# Patient Record
Sex: Female | Born: 1950
Health system: Southern US, Community
[De-identification: ages and names within clinical notes are randomized; demographics above are authoritative.]

## PROBLEM LIST (undated history)

## (undated) ENCOUNTER — Ambulatory Visit (HOSPITAL_COMMUNITY): Discharge status: Absent without leave | Payer: Medicare HMO

## (undated) DIAGNOSIS — R112 Nausea with vomiting, unspecified: Secondary | ICD-10-CM

## (undated) DIAGNOSIS — E039 Hypothyroidism, unspecified: Secondary | ICD-10-CM

## (undated) DIAGNOSIS — G5710 Meralgia paresthetica, unspecified lower limb: Secondary | ICD-10-CM

## (undated) DIAGNOSIS — R519 Headache, unspecified: Secondary | ICD-10-CM

## (undated) DIAGNOSIS — G4700 Insomnia, unspecified: Secondary | ICD-10-CM

## (undated) DIAGNOSIS — I10 Essential (primary) hypertension: Secondary | ICD-10-CM

## (undated) DIAGNOSIS — K219 Gastro-esophageal reflux disease without esophagitis: Secondary | ICD-10-CM

## (undated) DIAGNOSIS — N189 Chronic kidney disease, unspecified: Secondary | ICD-10-CM

## (undated) DIAGNOSIS — G5600 Carpal tunnel syndrome, unspecified upper limb: Secondary | ICD-10-CM

## (undated) DIAGNOSIS — Z9889 Other specified postprocedural states: Secondary | ICD-10-CM

## (undated) DIAGNOSIS — R51 Headache: Secondary | ICD-10-CM

## (undated) DIAGNOSIS — C73 Malignant neoplasm of thyroid gland: Secondary | ICD-10-CM

## (undated) DIAGNOSIS — E785 Hyperlipidemia, unspecified: Secondary | ICD-10-CM

## (undated) DIAGNOSIS — M1990 Unspecified osteoarthritis, unspecified site: Secondary | ICD-10-CM

## (undated) HISTORY — DX: Malignant neoplasm of thyroid gland: C73

## (undated) HISTORY — PX: THYROIDECTOMY: SHX17

## (undated) HISTORY — DX: Hyperlipidemia, unspecified: E78.5

## (undated) HISTORY — DX: Chronic kidney disease, unspecified: N18.9

## (undated) HISTORY — DX: Insomnia, unspecified: G47.00

## (undated) HISTORY — PX: KNEE SURGERY: SHX244

## (undated) HISTORY — PX: COLONOSCOPY: SHX174

## (undated) HISTORY — DX: Meralgia paresthetica, unspecified lower limb: G57.10

## (undated) HISTORY — DX: Carpal tunnel syndrome, unspecified upper limb: G56.00

---

## 1998-01-12 ENCOUNTER — Ambulatory Visit (HOSPITAL_COMMUNITY): Admission: RE | Admit: 1998-01-12 | Discharge: 1998-01-12 | Payer: Self-pay | Admitting: Internal Medicine

## 1998-01-12 ENCOUNTER — Encounter: Payer: Self-pay | Admitting: Internal Medicine

## 1998-02-04 ENCOUNTER — Ambulatory Visit (HOSPITAL_COMMUNITY): Admission: RE | Admit: 1998-02-04 | Discharge: 1998-02-04 | Payer: Self-pay | Admitting: Gastroenterology

## 1998-02-22 ENCOUNTER — Other Ambulatory Visit: Admission: RE | Admit: 1998-02-22 | Discharge: 1998-02-22 | Payer: Self-pay | Admitting: Obstetrics and Gynecology

## 1998-06-14 ENCOUNTER — Ambulatory Visit (HOSPITAL_COMMUNITY): Admission: RE | Admit: 1998-06-14 | Discharge: 1998-06-14 | Payer: Self-pay | Admitting: Obstetrics and Gynecology

## 1999-01-21 ENCOUNTER — Encounter: Payer: Self-pay | Admitting: Internal Medicine

## 1999-01-21 ENCOUNTER — Ambulatory Visit (HOSPITAL_COMMUNITY): Admission: RE | Admit: 1999-01-21 | Discharge: 1999-01-21 | Payer: Self-pay | Admitting: Internal Medicine

## 2000-08-10 ENCOUNTER — Encounter: Admission: RE | Admit: 2000-08-10 | Discharge: 2000-08-10 | Payer: Self-pay | Admitting: Orthopedic Surgery

## 2000-08-10 ENCOUNTER — Encounter: Payer: Self-pay | Admitting: Orthopedic Surgery

## 2001-05-21 ENCOUNTER — Other Ambulatory Visit: Admission: RE | Admit: 2001-05-21 | Discharge: 2001-05-21 | Payer: Self-pay | Admitting: Obstetrics and Gynecology

## 2001-05-30 ENCOUNTER — Ambulatory Visit (HOSPITAL_COMMUNITY): Admission: RE | Admit: 2001-05-30 | Discharge: 2001-05-30 | Payer: Self-pay | Admitting: Obstetrics and Gynecology

## 2001-05-30 ENCOUNTER — Encounter: Payer: Self-pay | Admitting: Obstetrics and Gynecology

## 2002-06-05 ENCOUNTER — Encounter: Payer: Self-pay | Admitting: Internal Medicine

## 2002-06-05 ENCOUNTER — Encounter: Admission: RE | Admit: 2002-06-05 | Discharge: 2002-06-05 | Payer: Self-pay | Admitting: Internal Medicine

## 2003-06-09 ENCOUNTER — Encounter: Admission: RE | Admit: 2003-06-09 | Discharge: 2003-06-09 | Payer: Self-pay | Admitting: Internal Medicine

## 2003-08-05 ENCOUNTER — Other Ambulatory Visit: Admission: RE | Admit: 2003-08-05 | Discharge: 2003-08-05 | Payer: Self-pay | Admitting: Internal Medicine

## 2003-08-11 ENCOUNTER — Encounter: Admission: RE | Admit: 2003-08-11 | Discharge: 2003-08-11 | Payer: Self-pay | Admitting: Internal Medicine

## 2003-08-11 ENCOUNTER — Encounter (HOSPITAL_COMMUNITY): Admission: RE | Admit: 2003-08-11 | Discharge: 2003-11-09 | Payer: Self-pay | Admitting: Internal Medicine

## 2003-08-21 ENCOUNTER — Encounter (INDEPENDENT_AMBULATORY_CARE_PROVIDER_SITE_OTHER): Payer: Self-pay | Admitting: Specialist

## 2004-07-11 ENCOUNTER — Encounter: Admission: RE | Admit: 2004-07-11 | Discharge: 2004-07-11 | Payer: Self-pay | Admitting: Internal Medicine

## 2004-11-11 ENCOUNTER — Other Ambulatory Visit: Admission: RE | Admit: 2004-11-11 | Discharge: 2004-11-11 | Payer: Self-pay | Admitting: Internal Medicine

## 2005-07-14 ENCOUNTER — Encounter: Admission: RE | Admit: 2005-07-14 | Discharge: 2005-07-14 | Payer: Self-pay | Admitting: Internal Medicine

## 2005-07-31 ENCOUNTER — Encounter: Admission: RE | Admit: 2005-07-31 | Discharge: 2005-07-31 | Payer: Self-pay | Admitting: Internal Medicine

## 2005-08-14 ENCOUNTER — Other Ambulatory Visit: Admission: RE | Admit: 2005-08-14 | Discharge: 2005-08-14 | Payer: Self-pay | Admitting: Obstetrics and Gynecology

## 2006-07-27 ENCOUNTER — Encounter: Admission: RE | Admit: 2006-07-27 | Discharge: 2006-07-27 | Payer: Self-pay | Admitting: Internal Medicine

## 2006-11-20 ENCOUNTER — Other Ambulatory Visit: Admission: RE | Admit: 2006-11-20 | Discharge: 2006-11-20 | Payer: Self-pay | Admitting: Internal Medicine

## 2007-08-02 ENCOUNTER — Encounter: Admission: RE | Admit: 2007-08-02 | Discharge: 2007-08-02 | Payer: Self-pay | Admitting: Internal Medicine

## 2007-12-13 ENCOUNTER — Other Ambulatory Visit: Admission: RE | Admit: 2007-12-13 | Discharge: 2007-12-13 | Payer: Self-pay | Admitting: Internal Medicine

## 2007-12-18 ENCOUNTER — Ambulatory Visit (HOSPITAL_COMMUNITY): Admission: RE | Admit: 2007-12-18 | Discharge: 2007-12-18 | Payer: Self-pay | Admitting: Internal Medicine

## 2008-01-08 ENCOUNTER — Encounter (INDEPENDENT_AMBULATORY_CARE_PROVIDER_SITE_OTHER): Payer: Self-pay | Admitting: Interventional Radiology

## 2008-01-08 ENCOUNTER — Ambulatory Visit (HOSPITAL_COMMUNITY): Admission: RE | Admit: 2008-01-08 | Discharge: 2008-01-08 | Payer: Self-pay | Admitting: Internal Medicine

## 2008-03-30 ENCOUNTER — Encounter (INDEPENDENT_AMBULATORY_CARE_PROVIDER_SITE_OTHER): Payer: Self-pay | Admitting: Surgery

## 2008-03-30 ENCOUNTER — Ambulatory Visit (HOSPITAL_COMMUNITY): Admission: RE | Admit: 2008-03-30 | Discharge: 2008-04-01 | Payer: Self-pay | Admitting: Surgery

## 2008-04-23 ENCOUNTER — Other Ambulatory Visit: Admission: RE | Admit: 2008-04-23 | Discharge: 2008-04-23 | Payer: Self-pay | Admitting: Internal Medicine

## 2008-08-03 ENCOUNTER — Encounter: Admission: RE | Admit: 2008-08-03 | Discharge: 2008-08-03 | Payer: Self-pay | Admitting: Internal Medicine

## 2009-03-29 ENCOUNTER — Encounter: Admission: RE | Admit: 2009-03-29 | Discharge: 2009-03-29 | Payer: Self-pay | Admitting: Gastroenterology

## 2009-06-24 ENCOUNTER — Other Ambulatory Visit: Admission: RE | Admit: 2009-06-24 | Discharge: 2009-06-24 | Payer: Self-pay | Admitting: Obstetrics and Gynecology

## 2009-08-06 ENCOUNTER — Encounter: Admission: RE | Admit: 2009-08-06 | Discharge: 2009-08-06 | Payer: Self-pay | Admitting: Internal Medicine

## 2010-01-30 ENCOUNTER — Encounter: Payer: Self-pay | Admitting: Internal Medicine

## 2010-04-12 ENCOUNTER — Other Ambulatory Visit: Payer: Self-pay | Admitting: Internal Medicine

## 2010-04-12 DIAGNOSIS — R12 Heartburn: Secondary | ICD-10-CM

## 2010-04-14 ENCOUNTER — Other Ambulatory Visit: Payer: Self-pay

## 2010-04-15 ENCOUNTER — Ambulatory Visit
Admission: RE | Admit: 2010-04-15 | Discharge: 2010-04-15 | Disposition: A | Discharge status: Home / Self Care | Payer: BC Managed Care – PPO | Source: Ambulatory Visit | Attending: Internal Medicine | Admitting: Internal Medicine

## 2010-04-15 DIAGNOSIS — R12 Heartburn: Secondary | ICD-10-CM

## 2010-04-21 LAB — DIFFERENTIAL
Basophils Absolute: 0 10*3/uL (ref 0.0–0.1)
Basophils Relative: 1 % (ref 0–1)
Eosinophils Absolute: 0 10*3/uL (ref 0.0–0.7)
Eosinophils Relative: 1 % (ref 0–5)
Lymphocytes Relative: 35 % (ref 12–46)
Lymphs Abs: 1.5 10*3/uL (ref 0.7–4.0)
Monocytes Absolute: 0.4 10*3/uL (ref 0.1–1.0)
Monocytes Relative: 9 % (ref 3–12)
Neutro Abs: 2.4 10*3/uL (ref 1.7–7.7)
Neutrophils Relative %: 55 % (ref 43–77)

## 2010-04-21 LAB — CBC
HCT: 39.4 % (ref 36.0–46.0)
Hemoglobin: 13.4 g/dL (ref 12.0–15.0)
MCHC: 33.9 g/dL (ref 30.0–36.0)
MCV: 89.5 fL (ref 78.0–100.0)
Platelets: 273 10*3/uL (ref 150–400)
RBC: 4.4 MIL/uL (ref 3.87–5.11)
RDW: 13.2 % (ref 11.5–15.5)
WBC: 4.4 10*3/uL (ref 4.0–10.5)

## 2010-04-21 LAB — CALCIUM
Calcium: 7.7 mg/dL — ABNORMAL LOW (ref 8.4–10.5)
Calcium: 7.7 mg/dL — ABNORMAL LOW (ref 8.4–10.5)
Calcium: 8.4 mg/dL (ref 8.4–10.5)

## 2010-04-21 LAB — URINALYSIS, ROUTINE W REFLEX MICROSCOPIC
Glucose, UA: NEGATIVE mg/dL
Hgb urine dipstick: NEGATIVE
Ketones, ur: NEGATIVE mg/dL
Nitrite: NEGATIVE
Protein, ur: NEGATIVE mg/dL
Specific Gravity, Urine: 1.03 (ref 1.005–1.030)
Urobilinogen, UA: 1 mg/dL (ref 0.0–1.0)
pH: 6 (ref 5.0–8.0)

## 2010-04-21 LAB — PROTIME-INR
INR: 0.9 (ref 0.00–1.49)
Prothrombin Time: 12.4 seconds (ref 11.6–15.2)

## 2010-04-21 LAB — BASIC METABOLIC PANEL
BUN: 10 mg/dL (ref 6–23)
CO2: 31 mEq/L (ref 19–32)
Calcium: 8.9 mg/dL (ref 8.4–10.5)
Chloride: 105 mEq/L (ref 96–112)
Creatinine, Ser: 0.76 mg/dL (ref 0.4–1.2)
GFR calc Af Amer: >60 mL/min (ref >60)
GFR calc non Af Amer: >60 mL/min (ref >60)
Glucose, Bld: 107 mg/dL — ABNORMAL HIGH (ref 70–99)
Potassium: 3.8 mEq/L (ref 3.5–5.1)
Sodium: 141 mEq/L (ref 135–145)

## 2010-05-24 NOTE — Op Note (Signed)
[name], [name]                ACCOUNT NO.:  [id number]   MEDICAL RECORD NO.:  [id number]          PATIENT TYPE:  OIB   LOCATION:  0098                         FACILITY:  [facility]   PHYSICIAN:  Velora Heckler, MD      DATE OF BIRTH:  08-21-1950   DATE OF PROCEDURE:  03/30/2008  DATE OF DISCHARGE:                               OPERATIVE REPORT   PREOPERATIVE DIAGNOSIS:  Thyroid goiter with compressive symptoms.   POSTOPERATIVE DIAGNOSIS:  Thyroid goiter with compressive symptoms.   PROCEDURE:  Total thyroidectomy.   SURGEON:  Velora Heckler, M.D., FACS   ASSISTANT:  Ovidio Kin, M.D., FACS   ANESTHESIA:  General per Dr. Sherrian Divers.   ESTIMATED BLOOD LOSS:  Minimal.   PREPARATION:  Betadine.   COMPLICATIONS:  None.   INDICATIONS:  The patient is a 60 year old black female from [place],  [place].  She has had a known thyroid goiter for approximately 25  years.  It has gradually increased in size.  There has been significant  increase in the thyroid nodules on ultrasound studies from 2005-2009.  The patient has developed mild compressive symptoms including occasional  hoarseness, dysphagia, globus sensation, and on occasion choking  episodes.  Fine-needle aspiration biopsies have had benign  cytopathology.  The patient has seen Dr. Talmage Coin in consultation.  He has now referred the patient to our practice for consideration for  total thyroidectomy.   BODY OF REPORT:  The procedure is done in OR #6 at the [facility].  The patient is brought to the operating room,  placed in a supine position on the operating room table.  Following  administration of general anesthesia, the patient is positioned and then  prepped and draped in the usual strict aseptic fashion.  After  ascertaining that an adequate level of anesthesia had been achieved, a  Kocher incision is made with a #15 blade.  Dissection is carried through  subcutaneous tissues and  platysma.  Hemostasis is obtained with the  electrocautery.  Subplatysmal flaps are then elevated from the thyroid  notch to the sternal notch.  A Mahorner self-retaining retractor is  placed for exposure.  Strap muscles are incised in the midline.  Dissection is begun on the left side of the neck.  Strap muscles are  reflected laterally and the left lobe is exposed.  Venous tributaries  are divided between medium Ligaclips with the Harmonic scalpel.  Superior pole was dissected out.  Superior pole vessels are divided  between medium Ligaclips with the Harmonic scalpel.  Care was taken to  preserve the superior parathyroid gland.  Inferiorly, there was a  multinodular goiter that extends beneath the left clavicle.  This was  gently mobilized with blunt dissection.  Venous tributaries are divided  between medium Ligaclips with the Harmonic scalpel.  The inferior vein  on the left is ligated in continuity with 2-0 silk ties and divided with  the Harmonic scalpel.  The gland is then mobilized anteriorly.  Branches  of the inferior thyroid artery are divided between small Ligaclips with  the Harmonic scalpel.  The recurrent nerve was identified and preserved.  Ligament of Allyson Sabal is transected with the electrocautery and the gland is  mobilized up and onto the anterior trachea.  There was a moderate-sized  colloid nodule inferiorly which was ruptured and evacuated.  Isthmus is  mobilized across the midline with the electrocautery.  There is a small  pyramidal lobe which was excised with the electrocautery with the  isthmus.  A dry pack is then placed in the left neck.   Next, the right side is addressed.  Strap muscles are reflected  laterally.  The right lobe is exposed.  Middle thyroid vein is divided  between Ligaclips with the Harmonic scalpel.  Superior pole is dissected  out.  Superior pole vessels are divided between medium Ligaclips with  the Harmonic scalpel.  Inferiorly, venous  tributaries are divided  between small and medium Ligaclips with the Harmonic scalpel.  The gland  is rolled anteriorly.  Recurrent nerve is identified and preserved.  Branches of the inferior thyroid artery are divided between small  Ligaclips.  Ligament of Allyson Sabal is transected with electrocautery and the  gland is excised off the anterior trachea using the electrocautery for  hemostasis.  The specimen is submitted to pathology.  A suture is used  to mark the left superior pole.  Neck is then copiously irrigated with  warm saline.  This was evacuated.  Good hemostasis is noted.  Surgicel  was placed bilaterally in the operative field.  Strap muscles were  reapproximated in the midline with interrupted 3-0 Vicryl sutures.  Platysma is closed with interrupted 3-0 Vicryl sutures.  Skin is closed  with a running 4-0 Monocryl subcuticular suture.  Wound is washed and  dried and Benzoin and Steri-Strips are applied.  Sterile dressings are  applied.  The patient is awakened from anesthesia and brought to the  recovery room in stable condition.  The patient tolerated the procedure  well.      Velora Heckler, MD  Electronically Signed     TMG/MEDQ  D:  03/30/2008  T:  03/30/2008  Job:  [id number]   cc:   Candyce Churn, M.D.  Fax: [phone]   Talmage Coin, M.D.

## 2010-06-28 ENCOUNTER — Other Ambulatory Visit (HOSPITAL_COMMUNITY)
Admission: RE | Admit: 2010-06-28 | Discharge: 2010-06-28 | Disposition: A | Discharge status: Home / Self Care | Payer: BC Managed Care – PPO | Source: Ambulatory Visit | Attending: Obstetrics and Gynecology | Admitting: Obstetrics and Gynecology

## 2010-06-28 ENCOUNTER — Other Ambulatory Visit: Payer: Self-pay | Admitting: Obstetrics and Gynecology

## 2010-06-28 DIAGNOSIS — Z01419 Encounter for gynecological examination (general) (routine) without abnormal findings: Secondary | ICD-10-CM | POA: Insufficient documentation

## 2010-07-14 ENCOUNTER — Other Ambulatory Visit: Payer: Self-pay | Admitting: Internal Medicine

## 2010-07-14 DIAGNOSIS — Z1231 Encounter for screening mammogram for malignant neoplasm of breast: Secondary | ICD-10-CM

## 2010-08-08 ENCOUNTER — Ambulatory Visit
Admission: RE | Admit: 2010-08-08 | Discharge: 2010-08-08 | Disposition: A | Discharge status: Home / Self Care | Payer: BC Managed Care – PPO | Source: Ambulatory Visit | Attending: Internal Medicine | Admitting: Internal Medicine

## 2010-08-08 DIAGNOSIS — Z1231 Encounter for screening mammogram for malignant neoplasm of breast: Secondary | ICD-10-CM

## 2011-06-27 ENCOUNTER — Other Ambulatory Visit: Payer: Self-pay | Admitting: Internal Medicine

## 2011-06-27 DIAGNOSIS — Z1231 Encounter for screening mammogram for malignant neoplasm of breast: Secondary | ICD-10-CM

## 2011-08-08 ENCOUNTER — Ambulatory Visit: Payer: BC Managed Care – PPO

## 2011-08-22 ENCOUNTER — Ambulatory Visit
Admission: RE | Admit: 2011-08-22 | Discharge: 2011-08-22 | Disposition: A | Discharge status: Home / Self Care | Payer: BC Managed Care – PPO | Source: Ambulatory Visit | Attending: Internal Medicine | Admitting: Internal Medicine

## 2011-08-22 DIAGNOSIS — Z1231 Encounter for screening mammogram for malignant neoplasm of breast: Secondary | ICD-10-CM

## 2011-09-14 ENCOUNTER — Other Ambulatory Visit (HOSPITAL_COMMUNITY)
Admission: RE | Admit: 2011-09-14 | Discharge: 2011-09-14 | Disposition: A | Discharge status: Home / Self Care | Payer: BC Managed Care – PPO | Source: Ambulatory Visit | Attending: Obstetrics and Gynecology | Admitting: Obstetrics and Gynecology

## 2011-09-14 ENCOUNTER — Other Ambulatory Visit: Payer: Self-pay | Admitting: Nurse Practitioner

## 2011-09-14 DIAGNOSIS — Z01419 Encounter for gynecological examination (general) (routine) without abnormal findings: Secondary | ICD-10-CM | POA: Insufficient documentation

## 2011-09-14 DIAGNOSIS — Z1151 Encounter for screening for human papillomavirus (HPV): Secondary | ICD-10-CM | POA: Insufficient documentation

## 2012-05-01 ENCOUNTER — Other Ambulatory Visit: Payer: Self-pay | Admitting: Internal Medicine

## 2012-07-22 ENCOUNTER — Other Ambulatory Visit: Payer: Self-pay

## 2012-07-22 DIAGNOSIS — Z1231 Encounter for screening mammogram for malignant neoplasm of breast: Secondary | ICD-10-CM

## 2012-08-22 ENCOUNTER — Ambulatory Visit
Admission: RE | Admit: 2012-08-22 | Discharge: 2012-08-22 | Disposition: A | Discharge status: Home / Self Care | Payer: BC Managed Care – PPO | Source: Ambulatory Visit

## 2012-08-22 DIAGNOSIS — Z1231 Encounter for screening mammogram for malignant neoplasm of breast: Secondary | ICD-10-CM

## 2013-07-21 ENCOUNTER — Other Ambulatory Visit: Payer: Self-pay

## 2013-07-21 DIAGNOSIS — Z1231 Encounter for screening mammogram for malignant neoplasm of breast: Secondary | ICD-10-CM

## 2013-08-25 ENCOUNTER — Encounter (INDEPENDENT_AMBULATORY_CARE_PROVIDER_SITE_OTHER): Payer: Self-pay

## 2013-08-25 ENCOUNTER — Ambulatory Visit
Admission: RE | Admit: 2013-08-25 | Discharge: 2013-08-25 | Disposition: A | Discharge status: Home / Self Care | Payer: 59 | Source: Ambulatory Visit

## 2013-08-25 DIAGNOSIS — Z1231 Encounter for screening mammogram for malignant neoplasm of breast: Secondary | ICD-10-CM

## 2014-01-12 DIAGNOSIS — C73 Malignant neoplasm of thyroid gland: Secondary | ICD-10-CM | POA: Diagnosis not present

## 2014-01-12 DIAGNOSIS — E039 Hypothyroidism, unspecified: Secondary | ICD-10-CM | POA: Diagnosis not present

## 2014-05-18 DIAGNOSIS — E785 Hyperlipidemia, unspecified: Secondary | ICD-10-CM | POA: Diagnosis not present

## 2014-05-18 DIAGNOSIS — E559 Vitamin D deficiency, unspecified: Secondary | ICD-10-CM | POA: Diagnosis not present

## 2014-05-18 DIAGNOSIS — C73 Malignant neoplasm of thyroid gland: Secondary | ICD-10-CM | POA: Diagnosis not present

## 2014-05-18 DIAGNOSIS — Z0001 Encounter for general adult medical examination with abnormal findings: Secondary | ICD-10-CM | POA: Diagnosis not present

## 2014-05-18 DIAGNOSIS — Z1389 Encounter for screening for other disorder: Secondary | ICD-10-CM | POA: Diagnosis not present

## 2014-05-18 DIAGNOSIS — E890 Postprocedural hypothyroidism: Secondary | ICD-10-CM | POA: Diagnosis not present

## 2014-05-18 DIAGNOSIS — Z79899 Other long term (current) drug therapy: Secondary | ICD-10-CM | POA: Diagnosis not present

## 2014-05-18 DIAGNOSIS — I10 Essential (primary) hypertension: Secondary | ICD-10-CM | POA: Diagnosis not present

## 2014-06-12 DIAGNOSIS — T7840XA Allergy, unspecified, initial encounter: Secondary | ICD-10-CM | POA: Diagnosis not present

## 2014-06-29 ENCOUNTER — Other Ambulatory Visit: Payer: Self-pay

## 2014-06-29 DIAGNOSIS — Z1231 Encounter for screening mammogram for malignant neoplasm of breast: Secondary | ICD-10-CM

## 2014-08-31 ENCOUNTER — Ambulatory Visit
Admission: RE | Admit: 2014-08-31 | Discharge: 2014-08-31 | Disposition: A | Discharge status: Home / Self Care | Payer: Commercial Managed Care - HMO | Source: Ambulatory Visit

## 2014-08-31 DIAGNOSIS — Z1231 Encounter for screening mammogram for malignant neoplasm of breast: Secondary | ICD-10-CM | POA: Diagnosis not present

## 2014-10-07 DIAGNOSIS — Z23 Encounter for immunization: Secondary | ICD-10-CM | POA: Diagnosis not present

## 2014-10-15 ENCOUNTER — Other Ambulatory Visit: Payer: Self-pay | Admitting: Nurse Practitioner

## 2014-10-15 ENCOUNTER — Other Ambulatory Visit (HOSPITAL_COMMUNITY)
Admission: RE | Admit: 2014-10-15 | Discharge: 2014-10-15 | Disposition: A | Discharge status: Home / Self Care | Payer: Commercial Managed Care - HMO | Source: Ambulatory Visit | Attending: Nurse Practitioner | Admitting: Nurse Practitioner

## 2014-10-15 DIAGNOSIS — Z1151 Encounter for screening for human papillomavirus (HPV): Secondary | ICD-10-CM | POA: Insufficient documentation

## 2014-10-15 DIAGNOSIS — Z01419 Encounter for gynecological examination (general) (routine) without abnormal findings: Secondary | ICD-10-CM | POA: Insufficient documentation

## 2014-10-15 DIAGNOSIS — R6882 Decreased libido: Secondary | ICD-10-CM | POA: Diagnosis not present

## 2014-10-16 LAB — CYTOLOGY - PAP

## 2014-10-27 DIAGNOSIS — N949 Unspecified condition associated with female genital organs and menstrual cycle: Secondary | ICD-10-CM | POA: Diagnosis not present

## 2014-12-21 DIAGNOSIS — H5210 Myopia, unspecified eye: Secondary | ICD-10-CM | POA: Diagnosis not present

## 2014-12-21 DIAGNOSIS — H5203 Hypermetropia, bilateral: Secondary | ICD-10-CM | POA: Diagnosis not present

## 2015-01-12 DIAGNOSIS — R7989 Other specified abnormal findings of blood chemistry: Secondary | ICD-10-CM | POA: Diagnosis not present

## 2015-01-12 DIAGNOSIS — R938 Abnormal findings on diagnostic imaging of other specified body structures: Secondary | ICD-10-CM | POA: Diagnosis not present

## 2015-01-12 DIAGNOSIS — C73 Malignant neoplasm of thyroid gland: Secondary | ICD-10-CM | POA: Diagnosis not present

## 2015-01-12 DIAGNOSIS — E039 Hypothyroidism, unspecified: Secondary | ICD-10-CM | POA: Diagnosis not present

## 2015-01-14 DIAGNOSIS — B351 Tinea unguium: Secondary | ICD-10-CM | POA: Diagnosis not present

## 2015-01-14 DIAGNOSIS — Z85850 Personal history of malignant neoplasm of thyroid: Secondary | ICD-10-CM | POA: Diagnosis not present

## 2015-01-14 DIAGNOSIS — L602 Onychogryphosis: Secondary | ICD-10-CM | POA: Diagnosis not present

## 2015-01-14 DIAGNOSIS — E039 Hypothyroidism, unspecified: Secondary | ICD-10-CM | POA: Diagnosis not present

## 2015-01-20 ENCOUNTER — Ambulatory Visit: Payer: Commercial Managed Care - HMO | Admitting: Podiatry

## 2015-01-25 ENCOUNTER — Ambulatory Visit: Payer: Commercial Managed Care - HMO | Admitting: Podiatry

## 2015-02-03 DIAGNOSIS — L602 Onychogryphosis: Secondary | ICD-10-CM | POA: Diagnosis not present

## 2015-02-03 DIAGNOSIS — B353 Tinea pedis: Secondary | ICD-10-CM | POA: Diagnosis not present

## 2015-02-04 ENCOUNTER — Encounter: Payer: Self-pay | Admitting: Podiatry

## 2015-02-04 ENCOUNTER — Ambulatory Visit (INDEPENDENT_AMBULATORY_CARE_PROVIDER_SITE_OTHER): Payer: Commercial Managed Care - HMO | Admitting: Podiatry

## 2015-02-04 VITALS — BP 140/79 | HR 89 | Resp 16

## 2015-02-04 DIAGNOSIS — B351 Tinea unguium: Secondary | ICD-10-CM

## 2015-02-04 NOTE — Progress Notes (Signed)
   Subjective:    Patient ID: [name], female    DOB: 25-Dec-1950, 65 y.o.   MRN: [id number]  HPI Patient presents with a bilateral nail problem; great toes; nail discoloration & thickened nails; x2 months.   Review of Systems  All other systems reviewed and are negative.      Objective:   Physical Exam        Assessment & Plan:

## 2015-02-04 NOTE — Progress Notes (Signed)
Subjective:     Patient ID: [name], female   DOB: [birth year], 65 y.o.   MRN: [id number]  HPI patient presents with concerned about discoloration of the hallux nail right over left with thickness and mild looseness of the hallux nail right   Review of Systems  All other systems reviewed and are negative.      Objective:   Physical Exam  Constitutional: She is oriented to person, place, and time.  Cardiovascular: Intact distal pulses.   Musculoskeletal: Normal range of motion.  Neurological: She is oriented to person, place, and time.  Skin: Skin is warm.  Nursing note and vitals reviewed.  neurovascular status intact muscle strength adequate range of motion within normal limits with patient found to have discoloration of the entire hallux nail right and moderate on the left with moderate looseness of the bed and slight darkness consistent with trauma good digital perfusion and well oriented 3     Assessment:     Probable trauma to the right hallux nail with subsequent fungal infection which may be Candida    Plan:     H&P condition reviewed and discussed allowing the nail to grow out. It may grow out normally require other treatment and today we will start antifungal topical and see results of this but patient will need to be patient with the results

## 2015-02-05 ENCOUNTER — Telehealth: Payer: Self-pay | Admitting: *Deleted

## 2015-02-05 NOTE — Telephone Encounter (Signed)
Pt states she was prescribed Formula 3 does she use it daily and wash off daily.  I told her use it daily and wash as normal.

## 2015-02-06 DIAGNOSIS — Z0100 Encounter for examination of eyes and vision without abnormal findings: Secondary | ICD-10-CM | POA: Diagnosis not present

## 2015-02-08 ENCOUNTER — Telehealth: Payer: Self-pay | Admitting: *Deleted

## 2015-02-08 NOTE — Telephone Encounter (Addendum)
Left message to call with concerns or to set up an appt.  Pt states I answered all of her questions Friday, except if she should continue to go to a nail salon.  I told her that if she did, she should choose one that is in with a hair salon because they are inspected by the health department as the hair salon is.

## 2015-02-16 DIAGNOSIS — J069 Acute upper respiratory infection, unspecified: Secondary | ICD-10-CM | POA: Diagnosis not present

## 2015-02-18 ENCOUNTER — Ambulatory Visit: Payer: Commercial Managed Care - HMO | Admitting: Podiatry

## 2015-06-14 DIAGNOSIS — H04129 Dry eye syndrome of unspecified lacrimal gland: Secondary | ICD-10-CM | POA: Diagnosis not present

## 2015-06-21 ENCOUNTER — Other Ambulatory Visit: Payer: Self-pay | Admitting: Geriatric Medicine

## 2015-06-21 ENCOUNTER — Ambulatory Visit
Admission: RE | Admit: 2015-06-21 | Discharge: 2015-06-21 | Disposition: A | Discharge status: Home / Self Care | Payer: Commercial Managed Care - HMO | Source: Ambulatory Visit | Attending: Geriatric Medicine | Admitting: Geriatric Medicine

## 2015-06-21 DIAGNOSIS — S62115A Nondisplaced fracture of triquetrum [cuneiform] bone, left wrist, initial encounter for closed fracture: Secondary | ICD-10-CM | POA: Diagnosis not present

## 2015-06-21 DIAGNOSIS — R52 Pain, unspecified: Secondary | ICD-10-CM

## 2015-06-21 DIAGNOSIS — M25532 Pain in left wrist: Secondary | ICD-10-CM | POA: Diagnosis not present

## 2015-06-21 DIAGNOSIS — S80812A Abrasion, left lower leg, initial encounter: Secondary | ICD-10-CM | POA: Diagnosis not present

## 2015-06-24 DIAGNOSIS — S63502A Unspecified sprain of left wrist, initial encounter: Secondary | ICD-10-CM | POA: Diagnosis not present

## 2015-07-03 DIAGNOSIS — H16221 Keratoconjunctivitis sicca, not specified as Sjogren's, right eye: Secondary | ICD-10-CM | POA: Diagnosis not present

## 2015-07-03 DIAGNOSIS — H01009 Unspecified blepharitis unspecified eye, unspecified eyelid: Secondary | ICD-10-CM | POA: Diagnosis not present

## 2015-07-19 DIAGNOSIS — S63502D Unspecified sprain of left wrist, subsequent encounter: Secondary | ICD-10-CM | POA: Diagnosis not present

## 2015-07-26 ENCOUNTER — Other Ambulatory Visit: Payer: Self-pay | Admitting: Internal Medicine

## 2015-07-26 DIAGNOSIS — Z1231 Encounter for screening mammogram for malignant neoplasm of breast: Secondary | ICD-10-CM

## 2015-07-27 DIAGNOSIS — Z79899 Other long term (current) drug therapy: Secondary | ICD-10-CM | POA: Diagnosis not present

## 2015-07-27 DIAGNOSIS — E890 Postprocedural hypothyroidism: Secondary | ICD-10-CM | POA: Diagnosis not present

## 2015-07-27 DIAGNOSIS — Z0001 Encounter for general adult medical examination with abnormal findings: Secondary | ICD-10-CM | POA: Diagnosis not present

## 2015-07-27 DIAGNOSIS — N760 Acute vaginitis: Secondary | ICD-10-CM | POA: Diagnosis not present

## 2015-07-27 DIAGNOSIS — E559 Vitamin D deficiency, unspecified: Secondary | ICD-10-CM | POA: Diagnosis not present

## 2015-07-27 DIAGNOSIS — E785 Hyperlipidemia, unspecified: Secondary | ICD-10-CM | POA: Diagnosis not present

## 2015-07-27 DIAGNOSIS — Z23 Encounter for immunization: Secondary | ICD-10-CM | POA: Diagnosis not present

## 2015-07-27 DIAGNOSIS — R938 Abnormal findings on diagnostic imaging of other specified body structures: Secondary | ICD-10-CM | POA: Diagnosis not present

## 2015-07-27 DIAGNOSIS — I10 Essential (primary) hypertension: Secondary | ICD-10-CM | POA: Diagnosis not present

## 2015-07-27 DIAGNOSIS — C73 Malignant neoplasm of thyroid gland: Secondary | ICD-10-CM | POA: Diagnosis not present

## 2015-07-27 DIAGNOSIS — Z1389 Encounter for screening for other disorder: Secondary | ICD-10-CM | POA: Diagnosis not present

## 2015-08-11 ENCOUNTER — Other Ambulatory Visit: Payer: Self-pay | Admitting: Nurse Practitioner

## 2015-08-11 DIAGNOSIS — N858 Other specified noninflammatory disorders of uterus: Secondary | ICD-10-CM | POA: Diagnosis not present

## 2015-08-11 DIAGNOSIS — R938 Abnormal findings on diagnostic imaging of other specified body structures: Secondary | ICD-10-CM | POA: Diagnosis not present

## 2015-08-19 DIAGNOSIS — S63502D Unspecified sprain of left wrist, subsequent encounter: Secondary | ICD-10-CM | POA: Diagnosis not present

## 2015-08-30 DIAGNOSIS — R938 Abnormal findings on diagnostic imaging of other specified body structures: Secondary | ICD-10-CM | POA: Diagnosis not present

## 2015-08-30 DIAGNOSIS — N840 Polyp of corpus uteri: Secondary | ICD-10-CM | POA: Diagnosis not present

## 2015-09-01 ENCOUNTER — Ambulatory Visit: Payer: Commercial Managed Care - HMO

## 2015-09-01 DIAGNOSIS — M25532 Pain in left wrist: Secondary | ICD-10-CM | POA: Diagnosis not present

## 2015-09-06 ENCOUNTER — Ambulatory Visit
Admission: RE | Admit: 2015-09-06 | Discharge: 2015-09-06 | Disposition: A | Discharge status: Home / Self Care | Payer: Commercial Managed Care - HMO | Source: Ambulatory Visit | Attending: Internal Medicine | Admitting: Internal Medicine

## 2015-09-06 DIAGNOSIS — Z1231 Encounter for screening mammogram for malignant neoplasm of breast: Secondary | ICD-10-CM

## 2015-09-07 DIAGNOSIS — M25532 Pain in left wrist: Secondary | ICD-10-CM | POA: Diagnosis not present

## 2015-09-14 DIAGNOSIS — M6281 Muscle weakness (generalized): Secondary | ICD-10-CM | POA: Diagnosis not present

## 2015-09-14 DIAGNOSIS — M25642 Stiffness of left hand, not elsewhere classified: Secondary | ICD-10-CM | POA: Diagnosis not present

## 2015-09-14 DIAGNOSIS — S62115A Nondisplaced fracture of triquetrum [cuneiform] bone, left wrist, initial encounter for closed fracture: Secondary | ICD-10-CM | POA: Diagnosis not present

## 2015-09-14 DIAGNOSIS — M79642 Pain in left hand: Secondary | ICD-10-CM | POA: Diagnosis not present

## 2015-09-29 DIAGNOSIS — M6281 Muscle weakness (generalized): Secondary | ICD-10-CM | POA: Diagnosis not present

## 2015-09-29 DIAGNOSIS — S62115A Nondisplaced fracture of triquetrum [cuneiform] bone, left wrist, initial encounter for closed fracture: Secondary | ICD-10-CM | POA: Diagnosis not present

## 2015-09-29 DIAGNOSIS — M25642 Stiffness of left hand, not elsewhere classified: Secondary | ICD-10-CM | POA: Diagnosis not present

## 2015-09-29 DIAGNOSIS — M79642 Pain in left hand: Secondary | ICD-10-CM | POA: Diagnosis not present

## 2015-10-04 DIAGNOSIS — M79642 Pain in left hand: Secondary | ICD-10-CM | POA: Diagnosis not present

## 2015-10-04 DIAGNOSIS — M6281 Muscle weakness (generalized): Secondary | ICD-10-CM | POA: Diagnosis not present

## 2015-10-04 DIAGNOSIS — M25642 Stiffness of left hand, not elsewhere classified: Secondary | ICD-10-CM | POA: Diagnosis not present

## 2015-10-04 DIAGNOSIS — S62115A Nondisplaced fracture of triquetrum [cuneiform] bone, left wrist, initial encounter for closed fracture: Secondary | ICD-10-CM | POA: Diagnosis not present

## 2015-10-06 DIAGNOSIS — M6281 Muscle weakness (generalized): Secondary | ICD-10-CM | POA: Diagnosis not present

## 2015-10-06 DIAGNOSIS — M25642 Stiffness of left hand, not elsewhere classified: Secondary | ICD-10-CM | POA: Diagnosis not present

## 2015-10-06 DIAGNOSIS — M79642 Pain in left hand: Secondary | ICD-10-CM | POA: Diagnosis not present

## 2015-10-06 DIAGNOSIS — S62115A Nondisplaced fracture of triquetrum [cuneiform] bone, left wrist, initial encounter for closed fracture: Secondary | ICD-10-CM | POA: Diagnosis not present

## 2015-10-11 DIAGNOSIS — M6281 Muscle weakness (generalized): Secondary | ICD-10-CM | POA: Diagnosis not present

## 2015-10-11 DIAGNOSIS — M25642 Stiffness of left hand, not elsewhere classified: Secondary | ICD-10-CM | POA: Diagnosis not present

## 2015-10-11 DIAGNOSIS — M79642 Pain in left hand: Secondary | ICD-10-CM | POA: Diagnosis not present

## 2015-10-11 DIAGNOSIS — S62115A Nondisplaced fracture of triquetrum [cuneiform] bone, left wrist, initial encounter for closed fracture: Secondary | ICD-10-CM | POA: Diagnosis not present

## 2015-10-13 DIAGNOSIS — M79642 Pain in left hand: Secondary | ICD-10-CM | POA: Diagnosis not present

## 2015-10-13 DIAGNOSIS — M6281 Muscle weakness (generalized): Secondary | ICD-10-CM | POA: Diagnosis not present

## 2015-10-13 DIAGNOSIS — S62115A Nondisplaced fracture of triquetrum [cuneiform] bone, left wrist, initial encounter for closed fracture: Secondary | ICD-10-CM | POA: Diagnosis not present

## 2015-10-13 DIAGNOSIS — M25642 Stiffness of left hand, not elsewhere classified: Secondary | ICD-10-CM | POA: Diagnosis not present

## 2015-10-20 DIAGNOSIS — M79642 Pain in left hand: Secondary | ICD-10-CM | POA: Diagnosis not present

## 2015-10-20 DIAGNOSIS — M25642 Stiffness of left hand, not elsewhere classified: Secondary | ICD-10-CM | POA: Diagnosis not present

## 2015-10-20 DIAGNOSIS — S62115A Nondisplaced fracture of triquetrum [cuneiform] bone, left wrist, initial encounter for closed fracture: Secondary | ICD-10-CM | POA: Diagnosis not present

## 2015-10-20 DIAGNOSIS — M6281 Muscle weakness (generalized): Secondary | ICD-10-CM | POA: Diagnosis not present

## 2015-10-27 ENCOUNTER — Other Ambulatory Visit: Payer: Self-pay | Admitting: Obstetrics & Gynecology

## 2015-10-27 DIAGNOSIS — N840 Polyp of corpus uteri: Secondary | ICD-10-CM | POA: Diagnosis not present

## 2015-11-08 DIAGNOSIS — M25642 Stiffness of left hand, not elsewhere classified: Secondary | ICD-10-CM | POA: Diagnosis not present

## 2015-11-08 DIAGNOSIS — M79642 Pain in left hand: Secondary | ICD-10-CM | POA: Diagnosis not present

## 2015-11-08 DIAGNOSIS — S62115A Nondisplaced fracture of triquetrum [cuneiform] bone, left wrist, initial encounter for closed fracture: Secondary | ICD-10-CM | POA: Diagnosis not present

## 2015-11-08 DIAGNOSIS — M6281 Muscle weakness (generalized): Secondary | ICD-10-CM | POA: Diagnosis not present

## 2015-11-10 DIAGNOSIS — M79642 Pain in left hand: Secondary | ICD-10-CM | POA: Diagnosis not present

## 2015-11-10 DIAGNOSIS — S62115A Nondisplaced fracture of triquetrum [cuneiform] bone, left wrist, initial encounter for closed fracture: Secondary | ICD-10-CM | POA: Diagnosis not present

## 2015-11-10 DIAGNOSIS — M25642 Stiffness of left hand, not elsewhere classified: Secondary | ICD-10-CM | POA: Diagnosis not present

## 2015-11-10 DIAGNOSIS — M6281 Muscle weakness (generalized): Secondary | ICD-10-CM | POA: Diagnosis not present

## 2015-11-12 DIAGNOSIS — Z4889 Encounter for other specified surgical aftercare: Secondary | ICD-10-CM | POA: Diagnosis not present

## 2015-11-12 DIAGNOSIS — N941 Unspecified dyspareunia: Secondary | ICD-10-CM | POA: Diagnosis not present

## 2015-11-15 DIAGNOSIS — M6281 Muscle weakness (generalized): Secondary | ICD-10-CM | POA: Diagnosis not present

## 2015-11-15 DIAGNOSIS — M25642 Stiffness of left hand, not elsewhere classified: Secondary | ICD-10-CM | POA: Diagnosis not present

## 2015-11-15 DIAGNOSIS — M79642 Pain in left hand: Secondary | ICD-10-CM | POA: Diagnosis not present

## 2015-11-15 DIAGNOSIS — S62115A Nondisplaced fracture of triquetrum [cuneiform] bone, left wrist, initial encounter for closed fracture: Secondary | ICD-10-CM | POA: Diagnosis not present

## 2015-11-19 DIAGNOSIS — M6281 Muscle weakness (generalized): Secondary | ICD-10-CM | POA: Diagnosis not present

## 2015-11-19 DIAGNOSIS — S62115A Nondisplaced fracture of triquetrum [cuneiform] bone, left wrist, initial encounter for closed fracture: Secondary | ICD-10-CM | POA: Diagnosis not present

## 2015-11-19 DIAGNOSIS — M79642 Pain in left hand: Secondary | ICD-10-CM | POA: Diagnosis not present

## 2015-11-19 DIAGNOSIS — M25642 Stiffness of left hand, not elsewhere classified: Secondary | ICD-10-CM | POA: Diagnosis not present

## 2015-12-27 DIAGNOSIS — H524 Presbyopia: Secondary | ICD-10-CM | POA: Diagnosis not present

## 2016-01-14 DIAGNOSIS — E039 Hypothyroidism, unspecified: Secondary | ICD-10-CM | POA: Diagnosis not present

## 2016-01-14 DIAGNOSIS — Z85850 Personal history of malignant neoplasm of thyroid: Secondary | ICD-10-CM | POA: Diagnosis not present

## 2016-01-14 DIAGNOSIS — J069 Acute upper respiratory infection, unspecified: Secondary | ICD-10-CM | POA: Diagnosis not present

## 2016-01-18 DIAGNOSIS — Z85850 Personal history of malignant neoplasm of thyroid: Secondary | ICD-10-CM | POA: Diagnosis not present

## 2016-01-18 DIAGNOSIS — Z8781 Personal history of (healed) traumatic fracture: Secondary | ICD-10-CM | POA: Diagnosis not present

## 2016-01-18 DIAGNOSIS — E039 Hypothyroidism, unspecified: Secondary | ICD-10-CM | POA: Diagnosis not present

## 2016-01-18 DIAGNOSIS — Z13820 Encounter for screening for osteoporosis: Secondary | ICD-10-CM | POA: Diagnosis not present

## 2016-02-02 ENCOUNTER — Other Ambulatory Visit: Payer: Self-pay | Admitting: Nurse Practitioner

## 2016-02-02 ENCOUNTER — Ambulatory Visit
Admission: RE | Admit: 2016-02-02 | Discharge: 2016-02-02 | Disposition: A | Discharge status: Home / Self Care | Payer: Commercial Managed Care - HMO | Source: Ambulatory Visit | Attending: Nurse Practitioner | Admitting: Nurse Practitioner

## 2016-02-02 DIAGNOSIS — M79674 Pain in right toe(s): Secondary | ICD-10-CM | POA: Diagnosis not present

## 2016-02-02 DIAGNOSIS — S92511A Displaced fracture of proximal phalanx of right lesser toe(s), initial encounter for closed fracture: Secondary | ICD-10-CM | POA: Diagnosis not present

## 2016-02-02 DIAGNOSIS — L905 Scar conditions and fibrosis of skin: Secondary | ICD-10-CM | POA: Diagnosis not present

## 2016-03-13 DIAGNOSIS — Z780 Asymptomatic menopausal state: Secondary | ICD-10-CM | POA: Diagnosis not present

## 2016-03-13 DIAGNOSIS — S92911A Unspecified fracture of right toe(s), initial encounter for closed fracture: Secondary | ICD-10-CM | POA: Diagnosis not present

## 2016-03-13 DIAGNOSIS — M79604 Pain in right leg: Secondary | ICD-10-CM | POA: Diagnosis not present

## 2016-03-13 DIAGNOSIS — L819 Disorder of pigmentation, unspecified: Secondary | ICD-10-CM | POA: Diagnosis not present

## 2016-03-13 DIAGNOSIS — Z13820 Encounter for screening for osteoporosis: Secondary | ICD-10-CM | POA: Diagnosis not present

## 2016-03-15 DIAGNOSIS — H2513 Age-related nuclear cataract, bilateral: Secondary | ICD-10-CM | POA: Diagnosis not present

## 2016-03-15 DIAGNOSIS — H16223 Keratoconjunctivitis sicca, not specified as Sjogren's, bilateral: Secondary | ICD-10-CM | POA: Diagnosis not present

## 2016-03-15 DIAGNOSIS — H01009 Unspecified blepharitis unspecified eye, unspecified eyelid: Secondary | ICD-10-CM | POA: Diagnosis not present

## 2016-03-28 ENCOUNTER — Other Ambulatory Visit: Payer: Self-pay

## 2016-03-28 DIAGNOSIS — D485 Neoplasm of uncertain behavior of skin: Secondary | ICD-10-CM | POA: Diagnosis not present

## 2016-03-28 DIAGNOSIS — L905 Scar conditions and fibrosis of skin: Secondary | ICD-10-CM | POA: Diagnosis not present

## 2016-04-19 ENCOUNTER — Ambulatory Visit (INDEPENDENT_AMBULATORY_CARE_PROVIDER_SITE_OTHER): Payer: Medicare HMO | Admitting: Podiatry

## 2016-04-19 ENCOUNTER — Ambulatory Visit (INDEPENDENT_AMBULATORY_CARE_PROVIDER_SITE_OTHER): Payer: Medicare HMO

## 2016-04-19 DIAGNOSIS — S92501A Displaced unspecified fracture of right lesser toe(s), initial encounter for closed fracture: Secondary | ICD-10-CM | POA: Diagnosis not present

## 2016-04-19 DIAGNOSIS — M79674 Pain in right toe(s): Secondary | ICD-10-CM

## 2016-04-19 DIAGNOSIS — M7751 Other enthesopathy of right foot: Secondary | ICD-10-CM

## 2016-04-19 DIAGNOSIS — M779 Enthesopathy, unspecified: Secondary | ICD-10-CM
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
## 2016-04-24 MED ORDER — BETAMETHASONE SOD PHOS & ACET 6 (3-3) MG/ML IJ SUSP: 3.0000 mg | Freq: Once | INTRAMUSCULAR | Status: AC

## 2016-05-17 ENCOUNTER — Ambulatory Visit: Payer: Medicare HMO | Admitting: Podiatry

## 2016-05-31 ENCOUNTER — Ambulatory Visit (INDEPENDENT_AMBULATORY_CARE_PROVIDER_SITE_OTHER): Payer: Medicare HMO

## 2016-05-31 ENCOUNTER — Ambulatory Visit (INDEPENDENT_AMBULATORY_CARE_PROVIDER_SITE_OTHER): Payer: Medicare HMO | Admitting: Podiatry

## 2016-05-31 DIAGNOSIS — S92501A Displaced unspecified fracture of right lesser toe(s), initial encounter for closed fracture: Secondary | ICD-10-CM

## 2016-05-31 DIAGNOSIS — M79676 Pain in unspecified toe(s): Secondary | ICD-10-CM | POA: Diagnosis not present

## 2016-06-01 NOTE — Progress Notes (Signed)
   Subjective: Patient is a 66 year old female presenting today for follow-up evaluation of right fourth toe pain. She states she still experiences soreness in the toe when she walks.   Objective/Physical Exam General: The patient is alert and oriented x3 in no acute distress.  Dermatology: Skin is warm, dry and supple bilateral lower extremities. Negative for open lesions or macerations.  Vascular: Palpable pedal pulses bilaterally. No edema or erythema noted. Capillary refill within normal limits.  Neurological: Epicritic and protective threshold grossly intact bilaterally.   Musculoskeletal Exam: Pain with palpation to the fourth toe of the right foot. Range of motion within normal limits to all pedal and ankle joints bilateral. Muscle strength 5/5 in all groups bilateral.   Radiographic Exam:  Transverse fracture of the base of fourth proximal phalanx with routine healing.   Assessment: #1 Fourth right toe fracture-DOI 02/01/16 #2 Fourth right MPJ capsulitis   Plan of Care:  #1 Patient was evaluated. X-rays reviewed. #2 patient may return to full activity with no restrictions. #3 return to clinic when necessary.    Edrick Kins, DPM [facility]  Dr. Edrick Kins, [address]                                        [place], [place] [address]                Office [phone]  Fax [phone]

## 2016-07-31 ENCOUNTER — Other Ambulatory Visit: Payer: Self-pay | Admitting: Internal Medicine

## 2016-07-31 DIAGNOSIS — Z1231 Encounter for screening mammogram for malignant neoplasm of breast: Secondary | ICD-10-CM

## 2016-08-28 DIAGNOSIS — E559 Vitamin D deficiency, unspecified: Secondary | ICD-10-CM | POA: Diagnosis not present

## 2016-08-28 DIAGNOSIS — Z79899 Other long term (current) drug therapy: Secondary | ICD-10-CM | POA: Diagnosis not present

## 2016-08-28 DIAGNOSIS — E785 Hyperlipidemia, unspecified: Secondary | ICD-10-CM | POA: Diagnosis not present

## 2016-08-28 DIAGNOSIS — Z0000 Encounter for general adult medical examination without abnormal findings: Secondary | ICD-10-CM | POA: Diagnosis not present

## 2016-08-28 DIAGNOSIS — H9390 Unspecified disorder of ear, unspecified ear: Secondary | ICD-10-CM | POA: Diagnosis not present

## 2016-08-28 DIAGNOSIS — Z1389 Encounter for screening for other disorder: Secondary | ICD-10-CM | POA: Diagnosis not present

## 2016-08-28 DIAGNOSIS — S92911A Unspecified fracture of right toe(s), initial encounter for closed fracture: Secondary | ICD-10-CM | POA: Diagnosis not present

## 2016-08-28 DIAGNOSIS — I10 Essential (primary) hypertension: Secondary | ICD-10-CM | POA: Diagnosis not present

## 2016-08-28 DIAGNOSIS — L989 Disorder of the skin and subcutaneous tissue, unspecified: Secondary | ICD-10-CM | POA: Diagnosis not present

## 2016-08-28 DIAGNOSIS — E890 Postprocedural hypothyroidism: Secondary | ICD-10-CM | POA: Diagnosis not present

## 2016-08-28 DIAGNOSIS — C73 Malignant neoplasm of thyroid gland: Secondary | ICD-10-CM | POA: Diagnosis not present

## 2016-09-04 DIAGNOSIS — L858 Other specified epidermal thickening: Secondary | ICD-10-CM | POA: Diagnosis not present

## 2016-09-04 DIAGNOSIS — L821 Other seborrheic keratosis: Secondary | ICD-10-CM | POA: Diagnosis not present

## 2016-09-07 ENCOUNTER — Ambulatory Visit
Admission: RE | Admit: 2016-09-07 | Discharge: 2016-09-07 | Disposition: A | Discharge status: Home / Self Care | Payer: Commercial Managed Care - HMO | Source: Ambulatory Visit | Attending: Internal Medicine | Admitting: Internal Medicine

## 2016-09-07 DIAGNOSIS — Z1231 Encounter for screening mammogram for malignant neoplasm of breast: Secondary | ICD-10-CM

## 2016-11-22 DIAGNOSIS — D485 Neoplasm of uncertain behavior of skin: Secondary | ICD-10-CM | POA: Diagnosis not present

## 2016-12-05 DIAGNOSIS — N941 Unspecified dyspareunia: Secondary | ICD-10-CM | POA: Diagnosis not present

## 2016-12-05 DIAGNOSIS — Z01419 Encounter for gynecological examination (general) (routine) without abnormal findings: Secondary | ICD-10-CM | POA: Diagnosis not present

## 2016-12-14 ENCOUNTER — Ambulatory Visit (INDEPENDENT_AMBULATORY_CARE_PROVIDER_SITE_OTHER): Payer: Medicare HMO

## 2016-12-14 ENCOUNTER — Encounter: Payer: Self-pay | Admitting: Podiatry

## 2016-12-14 ENCOUNTER — Ambulatory Visit: Payer: Medicare HMO | Admitting: Podiatry

## 2016-12-14 ENCOUNTER — Other Ambulatory Visit: Payer: Self-pay | Admitting: Podiatry

## 2016-12-14 DIAGNOSIS — S92501A Displaced unspecified fracture of right lesser toe(s), initial encounter for closed fracture: Secondary | ICD-10-CM

## 2016-12-14 DIAGNOSIS — M79671 Pain in right foot: Secondary | ICD-10-CM

## 2016-12-15 NOTE — Progress Notes (Signed)
Subjective:   Patient ID: [name], female   DOB: 66 y.o.   MRN: [id number]   HPI Patient presents stating that she injured the third toe on her right foot and she is concerned about fracture and she had a previous fracture of her fourth toe.   ROS      Objective:  Physical Exam  Neurovascular status intact muscle strength is adequate with patient noted to have swelling of the third digit right and of the MPJ with mild discomfort also of the fourth digit right that is localized in nature with no break in skin.     Assessment:  Possibility for fracture of the third digit right with healing fracture fourth digit right.     Plan:  X-rays reviewed today and advised on continued surgical shoe usage with a rigid bottom shoe.  Patient will be seen back for [place] to recheck if symptoms persist past another 4-6 weeks.  X-rays indicate there is a healing fracture fourth digit right with no current indication fracture third digit right or of the metatarsal joint

## 2017-01-08 DIAGNOSIS — H25813 Combined forms of age-related cataract, bilateral: Secondary | ICD-10-CM | POA: Diagnosis not present

## 2017-01-08 DIAGNOSIS — I10 Essential (primary) hypertension: Secondary | ICD-10-CM | POA: Diagnosis not present

## 2017-01-08 DIAGNOSIS — H52223 Regular astigmatism, bilateral: Secondary | ICD-10-CM | POA: Diagnosis not present

## 2017-01-08 DIAGNOSIS — H5203 Hypermetropia, bilateral: Secondary | ICD-10-CM | POA: Diagnosis not present

## 2017-01-08 DIAGNOSIS — H11159 Pinguecula, unspecified eye: Secondary | ICD-10-CM | POA: Diagnosis not present

## 2017-01-08 DIAGNOSIS — H04123 Dry eye syndrome of bilateral lacrimal glands: Secondary | ICD-10-CM | POA: Diagnosis not present

## 2017-01-08 DIAGNOSIS — H524 Presbyopia: Secondary | ICD-10-CM | POA: Diagnosis not present

## 2017-01-22 DIAGNOSIS — Z85850 Personal history of malignant neoplasm of thyroid: Secondary | ICD-10-CM | POA: Diagnosis not present

## 2017-01-22 DIAGNOSIS — E039 Hypothyroidism, unspecified: Secondary | ICD-10-CM | POA: Diagnosis not present

## 2017-01-24 DIAGNOSIS — I10 Essential (primary) hypertension: Secondary | ICD-10-CM | POA: Diagnosis not present

## 2017-01-24 DIAGNOSIS — E039 Hypothyroidism, unspecified: Secondary | ICD-10-CM | POA: Diagnosis not present

## 2017-01-24 DIAGNOSIS — Z8781 Personal history of (healed) traumatic fracture: Secondary | ICD-10-CM | POA: Diagnosis not present

## 2017-01-24 DIAGNOSIS — Z85850 Personal history of malignant neoplasm of thyroid: Secondary | ICD-10-CM | POA: Diagnosis not present

## 2017-02-01 DIAGNOSIS — M1990 Unspecified osteoarthritis, unspecified site: Secondary | ICD-10-CM | POA: Diagnosis not present

## 2017-02-01 DIAGNOSIS — M179 Osteoarthritis of knee, unspecified: Secondary | ICD-10-CM | POA: Diagnosis not present

## 2017-02-01 DIAGNOSIS — E785 Hyperlipidemia, unspecified: Secondary | ICD-10-CM | POA: Diagnosis not present

## 2017-02-01 DIAGNOSIS — I10 Essential (primary) hypertension: Secondary | ICD-10-CM | POA: Diagnosis not present

## 2017-02-01 DIAGNOSIS — E039 Hypothyroidism, unspecified: Secondary | ICD-10-CM | POA: Diagnosis not present

## 2017-07-05 DIAGNOSIS — M1990 Unspecified osteoarthritis, unspecified site: Secondary | ICD-10-CM | POA: Diagnosis not present

## 2017-07-05 DIAGNOSIS — E785 Hyperlipidemia, unspecified: Secondary | ICD-10-CM | POA: Diagnosis not present

## 2017-07-05 DIAGNOSIS — I10 Essential (primary) hypertension: Secondary | ICD-10-CM | POA: Diagnosis not present

## 2017-07-05 DIAGNOSIS — E890 Postprocedural hypothyroidism: Secondary | ICD-10-CM | POA: Diagnosis not present

## 2017-07-05 DIAGNOSIS — E039 Hypothyroidism, unspecified: Secondary | ICD-10-CM | POA: Diagnosis not present

## 2017-07-05 DIAGNOSIS — M179 Osteoarthritis of knee, unspecified: Secondary | ICD-10-CM | POA: Diagnosis not present

## 2017-07-30 ENCOUNTER — Other Ambulatory Visit: Payer: Self-pay | Admitting: Internal Medicine

## 2017-07-30 DIAGNOSIS — Z1231 Encounter for screening mammogram for malignant neoplasm of breast: Secondary | ICD-10-CM

## 2017-08-03 DIAGNOSIS — I10 Essential (primary) hypertension: Secondary | ICD-10-CM | POA: Diagnosis not present

## 2017-08-03 DIAGNOSIS — E890 Postprocedural hypothyroidism: Secondary | ICD-10-CM | POA: Diagnosis not present

## 2017-08-03 DIAGNOSIS — E039 Hypothyroidism, unspecified: Secondary | ICD-10-CM | POA: Diagnosis not present

## 2017-08-03 DIAGNOSIS — M179 Osteoarthritis of knee, unspecified: Secondary | ICD-10-CM | POA: Diagnosis not present

## 2017-08-03 DIAGNOSIS — E785 Hyperlipidemia, unspecified: Secondary | ICD-10-CM | POA: Diagnosis not present

## 2017-08-07 ENCOUNTER — Ambulatory Visit (INDEPENDENT_AMBULATORY_CARE_PROVIDER_SITE_OTHER): Payer: Medicare HMO

## 2017-08-07 ENCOUNTER — Encounter (HOSPITAL_COMMUNITY): Payer: Self-pay | Admitting: Emergency Medicine

## 2017-08-07 ENCOUNTER — Ambulatory Visit (HOSPITAL_COMMUNITY)
Admission: EM | Admit: 2017-08-07 | Discharge: 2017-08-07 | Disposition: A | Discharge status: Home / Self Care | Payer: Medicare HMO | Attending: Internal Medicine | Admitting: Internal Medicine

## 2017-08-07 DIAGNOSIS — M62830 Muscle spasm of back: Secondary | ICD-10-CM | POA: Diagnosis not present

## 2017-08-07 DIAGNOSIS — S39012A Strain of muscle, fascia and tendon of lower back, initial encounter: Secondary | ICD-10-CM

## 2017-08-07 DIAGNOSIS — M25562 Pain in left knee: Secondary | ICD-10-CM

## 2017-08-07 DIAGNOSIS — M62838 Other muscle spasm: Secondary | ICD-10-CM

## 2017-08-07 DIAGNOSIS — M545 Low back pain: Secondary | ICD-10-CM | POA: Diagnosis not present

## 2017-08-07 DIAGNOSIS — M25511 Pain in right shoulder: Secondary | ICD-10-CM

## 2017-08-07 DIAGNOSIS — M25512 Pain in left shoulder: Secondary | ICD-10-CM

## 2017-08-07 DIAGNOSIS — S8992XA Unspecified injury of left lower leg, initial encounter: Secondary | ICD-10-CM | POA: Diagnosis not present

## 2017-08-07 MED ORDER — CYCLOBENZAPRINE HCL 5 MG PO TABS: 5.0000 mg | ORAL_TABLET | Freq: Every day | ORAL | 0 refills | Status: DC

## 2017-08-07 MED ORDER — NAPROXEN 375 MG PO TABS: 375.0000 mg | ORAL_TABLET | Freq: Two times a day (BID) | ORAL | 0 refills | Status: DC
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
## 2017-08-07 NOTE — Discharge Instructions (Signed)
X-rays did not show any fractures or dislocations, just degenerative changes Rest, ice and heat as needed Ensure adequate ROM as tolerated. Injuries all appear to be muscular in nature. Prescribed naproxen as needed for inflammation and pain relief Prescribed flexeril as needed at bedtime for muscle spasm.  Do not drive or operate heavy machinery while taking this medication Expect some increased pain in the next 1-3 days.  It may take 3-4 weeks for complete resolution of symptoms Will f/u with her doctor or here if not seeing significant improvement within one week. Return here or go to ER if you have any new or worsening symptoms such as numbness/tingling of the inner thighs, loss of bladder or bowel control, headache/blurry vision, nausea/vomiting, confusion/altered mental status, dizziness, weakness, passing out, imbalance, etc..Marland Kitchen
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
## 2017-08-14 DIAGNOSIS — M545 Low back pain: Secondary | ICD-10-CM | POA: Diagnosis not present

## 2017-08-14 DIAGNOSIS — S134XXA Sprain of ligaments of cervical spine, initial encounter: Secondary | ICD-10-CM | POA: Diagnosis not present

## 2017-08-14 DIAGNOSIS — M546 Pain in thoracic spine: Secondary | ICD-10-CM | POA: Diagnosis not present

## 2017-08-14 DIAGNOSIS — M25562 Pain in left knee: Secondary | ICD-10-CM | POA: Diagnosis not present

## 2017-08-24 ENCOUNTER — Other Ambulatory Visit: Payer: Self-pay

## 2017-08-24 ENCOUNTER — Institutional Professional Consult (permissible substitution): Payer: Medicare HMO | Admitting: Cardiothoracic Surgery

## 2017-08-24 ENCOUNTER — Encounter: Payer: Self-pay | Admitting: Cardiothoracic Surgery

## 2017-08-24 VITALS — BP 115/67 | HR 102 | Resp 18 | Ht 63.0 in | Wt 141.4 lb

## 2017-08-24 DIAGNOSIS — J9859 Other diseases of mediastinum, not elsewhere classified: Secondary | ICD-10-CM
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
## 2017-08-27 ENCOUNTER — Other Ambulatory Visit: Payer: Self-pay | Admitting: *Deleted

## 2017-08-27 ENCOUNTER — Encounter: Payer: Self-pay | Admitting: *Deleted

## 2017-08-27 DIAGNOSIS — J9859 Other diseases of mediastinum, not elsewhere classified: Secondary | ICD-10-CM

## 2017-09-03 ENCOUNTER — Ambulatory Visit (HOSPITAL_COMMUNITY)
Admission: RE | Admit: 2017-09-03 | Discharge: 2017-09-03 | Disposition: A | Discharge status: Home / Self Care | Payer: Medicare HMO | Source: Ambulatory Visit | Attending: Cardiothoracic Surgery | Admitting: Cardiothoracic Surgery

## 2017-09-03 DIAGNOSIS — Z01810 Encounter for preprocedural cardiovascular examination: Secondary | ICD-10-CM | POA: Insufficient documentation

## 2017-09-03 DIAGNOSIS — S134XXD Sprain of ligaments of cervical spine, subsequent encounter: Secondary | ICD-10-CM | POA: Diagnosis not present

## 2017-09-03 DIAGNOSIS — Z0001 Encounter for general adult medical examination with abnormal findings: Secondary | ICD-10-CM | POA: Diagnosis not present

## 2017-09-03 DIAGNOSIS — I10 Essential (primary) hypertension: Secondary | ICD-10-CM | POA: Diagnosis not present

## 2017-09-03 DIAGNOSIS — I119 Hypertensive heart disease without heart failure: Secondary | ICD-10-CM | POA: Diagnosis not present

## 2017-09-03 DIAGNOSIS — M545 Low back pain: Secondary | ICD-10-CM | POA: Diagnosis not present

## 2017-09-03 DIAGNOSIS — J9859 Other diseases of mediastinum, not elsewhere classified: Secondary | ICD-10-CM | POA: Insufficient documentation

## 2017-09-03 DIAGNOSIS — Z1389 Encounter for screening for other disorder: Secondary | ICD-10-CM | POA: Diagnosis not present

## 2017-09-03 DIAGNOSIS — Z79899 Other long term (current) drug therapy: Secondary | ICD-10-CM | POA: Diagnosis not present

## 2017-09-03 DIAGNOSIS — I081 Rheumatic disorders of both mitral and tricuspid valves: Secondary | ICD-10-CM | POA: Insufficient documentation

## 2017-09-03 DIAGNOSIS — E039 Hypothyroidism, unspecified: Secondary | ICD-10-CM | POA: Diagnosis not present

## 2017-09-03 DIAGNOSIS — R222 Localized swelling, mass and lump, trunk: Secondary | ICD-10-CM | POA: Insufficient documentation

## 2017-09-03 DIAGNOSIS — E785 Hyperlipidemia, unspecified: Secondary | ICD-10-CM | POA: Diagnosis not present

## 2017-09-03 DIAGNOSIS — I361 Nonrheumatic tricuspid (valve) insufficiency: Secondary | ICD-10-CM

## 2017-09-03 DIAGNOSIS — M546 Pain in thoracic spine: Secondary | ICD-10-CM | POA: Diagnosis not present

## 2017-09-03 NOTE — Progress Notes (Signed)
Echocardiogram 2D Echocardiogram has been performed.  [name] 09/03/2017, 9:02 AM

## 2017-09-04 LAB — HCG, QUANTITATIVE, PREGNANCY: HCG, Total, QN: <2 m[IU]/mL

## 2017-09-04 LAB — AFP TUMOR MARKER: AFP-Tumor Marker: 2.1 ng/mL

## 2017-09-06 NOTE — Pre-Procedure Instructions (Signed)
[name]  09/06/2017      [facility] [address] [place] [address] [place] [address] Phone: [phone] Fax: [facility] [place], [place] [address] Phone: [phone] Fax: [phone]    Your procedure is scheduled on September 3  Report to [facility] at [place].M.  Call this number if you have problems the morning of surgery:  [phone]   Remember:  Do not eat or drink after midnight.    Take these medicines the morning of surgery with A SIP OF WATER  Eye drops if needed levothyroxine (SYNTHROID, LEVOTHROID) metoprolol tartrate (LOPRESSOR)   7 days prior to surgery STOP taking any Aspirin(unless otherwise instructed by your surgeon), Aleve, Naproxen, Ibuprofen, Motrin, Advil, Goody's, BC's, all herbal medications, fish oil, and all vitamins     Do not wear jewelry, make-up or nail polish.  Do not wear lotions, powders, or perfumes, or deodorant.  Do not shave 48 hours prior to surgery.    Do not bring valuables to the hospital.  [facility] is not responsible for any belongings or valuables.  Contacts, dentures or bridgework may not be worn into surgery.  Leave your suitcase in the car.  After surgery it may be brought to your room.  For patients admitted to the hospital, discharge time will be determined by your treatment team.  Patients discharged the day of surgery will not be allowed to drive home.    Special instructions:   [facility]- Preparing For Surgery  Before surgery, you can play an important role. Because skin is not sterile, your skin needs to be as free of germs as possible. You can reduce the number of germs on your skin by washing with CHG (chlorahexidine gluconate) Soap before surgery.  CHG is an antiseptic cleaner which kills germs and bonds with the skin to continue killing germs  even after washing.    Oral Hygiene is also important to reduce your risk of infection.  Remember - BRUSH YOUR TEETH THE MORNING OF SURGERY WITH YOUR REGULAR TOOTHPASTE  Please do not use if you have an allergy to CHG or antibacterial soaps. If your skin becomes reddened/irritated stop using the CHG.  Do not shave (including legs and underarms) for at least 48 hours prior to first CHG shower. It is OK to shave your face.  Please follow these instructions carefully.   1. Shower the NIGHT BEFORE SURGERY and the MORNING OF SURGERY with CHG.   2. If you chose to wash your hair, wash your hair first as usual with your normal shampoo.  3. After you shampoo, rinse your hair and body thoroughly to remove the shampoo.  4. Use CHG as you would any other liquid soap. You can apply CHG directly to the skin and wash gently with a scrungie or a clean washcloth.   5. Apply the CHG Soap to your body ONLY FROM THE NECK DOWN.  Do not use on open wounds or open sores. Avoid contact with your eyes, ears, mouth and genitals (private parts). Wash Face and genitals (private parts)  with your normal soap.  6. Wash thoroughly, paying special attention to the area where your surgery will be performed.  7. Thoroughly rinse your body with warm water from the neck down.  8. DO NOT shower/wash with your normal soap after using and rinsing off the CHG  Soap.  9. Pat yourself dry with a CLEAN TOWEL.  10. Wear CLEAN PAJAMAS to bed the night before surgery, wear comfortable clothes the morning of surgery  11. Place CLEAN SHEETS on your bed the night of your first shower and DO NOT SLEEP WITH PETS.    Day of Surgery:  Do not apply any deodorants/lotions.  Please wear clean clothes to the hospital/surgery center.   Remember to brush your teeth WITH YOUR REGULAR TOOTHPASTE.    Please read over the following fact sheets that you were given.

## 2017-09-07 ENCOUNTER — Encounter (HOSPITAL_COMMUNITY)
Admission: RE | Admit: 2017-09-07 | Discharge: 2017-09-07 | Disposition: A | Discharge status: Home / Self Care | Payer: Medicare HMO | Source: Ambulatory Visit | Attending: Cardiothoracic Surgery | Admitting: Cardiothoracic Surgery

## 2017-09-07 ENCOUNTER — Encounter (HOSPITAL_COMMUNITY): Payer: Self-pay

## 2017-09-07 ENCOUNTER — Other Ambulatory Visit: Payer: Self-pay

## 2017-09-07 ENCOUNTER — Ambulatory Visit (HOSPITAL_COMMUNITY): Admission: RE | Admit: 2017-09-07 | Payer: Medicare HMO | Source: Ambulatory Visit

## 2017-09-07 DIAGNOSIS — Z01812 Encounter for preprocedural laboratory examination: Secondary | ICD-10-CM | POA: Insufficient documentation

## 2017-09-07 DIAGNOSIS — Z01810 Encounter for preprocedural cardiovascular examination: Secondary | ICD-10-CM | POA: Diagnosis not present

## 2017-09-07 DIAGNOSIS — I440 Atrioventricular block, first degree: Secondary | ICD-10-CM | POA: Insufficient documentation

## 2017-09-07 DIAGNOSIS — I10 Essential (primary) hypertension: Secondary | ICD-10-CM | POA: Insufficient documentation

## 2017-09-07 DIAGNOSIS — J9859 Other diseases of mediastinum, not elsewhere classified: Secondary | ICD-10-CM | POA: Diagnosis not present

## 2017-09-07 HISTORY — DX: Gastro-esophageal reflux disease without esophagitis: K21.9

## 2017-09-07 HISTORY — DX: Other specified postprocedural states: Z98.890

## 2017-09-07 HISTORY — DX: Headache: R51

## 2017-09-07 HISTORY — DX: Headache, unspecified: R51.9

## 2017-09-07 HISTORY — DX: Nausea with vomiting, unspecified: R11.2

## 2017-09-07 HISTORY — DX: Essential (primary) hypertension: I10

## 2017-09-07 LAB — BLOOD GAS, ARTERIAL
Acid-Base Excess: 2.6 mmol/L — ABNORMAL HIGH (ref 0.0–2.0)
Bicarbonate: 26.9 mmol/L (ref 20.0–28.0)
Drawn by: 421801
O2 Saturation: 98.2 %
Patient temperature: 98.6
pCO2 arterial: 43.2 mmHg (ref 32.0–48.0)
pH, Arterial: 7.41 (ref 7.350–7.450)
pO2, Arterial: 107 mmHg (ref 83.0–108.0)

## 2017-09-07 LAB — COMPREHENSIVE METABOLIC PANEL
ALT: 16 U/L (ref 0–44)
AST: 19 U/L (ref 15–41)
Albumin: 3.7 g/dL (ref 3.5–5.0)
Alkaline Phosphatase: 74 U/L (ref 38–126)
Anion gap: 10 (ref 5–15)
BUN: 19 mg/dL (ref 8–23)
CO2: 24 mmol/L (ref 22–32)
Calcium: 9 mg/dL (ref 8.9–10.3)
Chloride: 106 mmol/L (ref 98–111)
Creatinine, Ser: 0.93 mg/dL (ref 0.44–1.00)
GFR calc Af Amer: >60 mL/min (ref >60)
GFR calc non Af Amer: >60 mL/min (ref >60)
Glucose, Bld: 102 mg/dL — ABNORMAL HIGH (ref 70–99)
Potassium: 3.8 mmol/L (ref 3.5–5.1)
Sodium: 140 mmol/L (ref 135–145)
Total Bilirubin: 0.5 mg/dL (ref 0.3–1.2)
Total Protein: 7.1 g/dL (ref 6.5–8.1)

## 2017-09-07 LAB — CBC
HCT: 40 % (ref 36.0–46.0)
Hemoglobin: 12.7 g/dL (ref 12.0–15.0)
MCH: 29.1 pg (ref 26.0–34.0)
MCHC: 31.8 g/dL (ref 30.0–36.0)
MCV: 91.5 fL (ref 78.0–100.0)
Platelets: 247 10*3/uL (ref 150–400)
RBC: 4.37 MIL/uL (ref 3.87–5.11)
RDW: 13 % (ref 11.5–15.5)
WBC: 5.8 10*3/uL (ref 4.0–10.5)

## 2017-09-07 LAB — ABO/RH: ABO/RH(D): B POS

## 2017-09-07 LAB — URINALYSIS, ROUTINE W REFLEX MICROSCOPIC
Bilirubin Urine: NEGATIVE
Glucose, UA: NEGATIVE mg/dL
Hgb urine dipstick: NEGATIVE
Ketones, ur: NEGATIVE mg/dL
Leukocytes, UA: NEGATIVE
Nitrite: NEGATIVE
Protein, ur: NEGATIVE mg/dL
Specific Gravity, Urine: 1.023 (ref 1.005–1.030)
pH: 5 (ref 5.0–8.0)

## 2017-09-07 LAB — PROTIME-INR
INR: 0.96
Prothrombin Time: 12.7 seconds (ref 11.4–15.2)

## 2017-09-07 LAB — SURGICAL PCR SCREEN
MRSA, PCR: NEGATIVE
Staphylococcus aureus: POSITIVE — AB

## 2017-09-07 LAB — APTT: aPTT: 26 seconds (ref 24–36)

## 2017-09-07 NOTE — Progress Notes (Addendum)
PCP -  Josetta Huddle MD   Chest x-ray -  Day of surgery EKG -  09/07/17  ECHO -  2019  Blood Thinner Instructions: NA Aspirin Instructions: NA  Anesthesia review: yes. EKG review  Patient denies shortness of breath, fever, cough and chest pain at PAT appointment   Patient verbalized understanding of instructions that were given to them at the PAT appointment. Patient was also instructed that they will need to review over the PAT instructions again at home before surgery.

## 2017-09-07 NOTE — Progress Notes (Signed)
Mupirocin Ointment Rx called into [facility] on [address] for positive PCR of Staph. Pt notified of results and need to pick up Rx. Pt voiced understanding.

## 2017-09-11 ENCOUNTER — Inpatient Hospital Stay (HOSPITAL_COMMUNITY)
Admission: RE | Admit: 2017-09-11 | Discharge: 2017-09-15 | DRG: 629 | Disposition: A | Discharge status: Home / Self Care | Payer: Medicare HMO | Attending: Cardiothoracic Surgery | Admitting: Cardiothoracic Surgery

## 2017-09-11 ENCOUNTER — Other Ambulatory Visit: Payer: Self-pay | Admitting: Cardiothoracic Surgery

## 2017-09-11 ENCOUNTER — Encounter (HOSPITAL_COMMUNITY)
Admission: RE | Disposition: A | Discharge status: Home / Self Care | Payer: Self-pay | Source: Home / Self Care | Attending: Cardiothoracic Surgery

## 2017-09-11 ENCOUNTER — Other Ambulatory Visit: Payer: Self-pay | Admitting: *Deleted

## 2017-09-11 ENCOUNTER — Inpatient Hospital Stay (HOSPITAL_COMMUNITY): Payer: Medicare HMO

## 2017-09-11 ENCOUNTER — Other Ambulatory Visit: Payer: Self-pay

## 2017-09-11 ENCOUNTER — Ambulatory Visit
Admission: RE | Admit: 2017-09-11 | Discharge: 2017-09-11 | Disposition: A | Discharge status: Home / Self Care | Payer: Self-pay | Source: Ambulatory Visit | Attending: Cardiothoracic Surgery | Admitting: Cardiothoracic Surgery

## 2017-09-11 ENCOUNTER — Inpatient Hospital Stay (HOSPITAL_COMMUNITY): Payer: Medicare HMO | Admitting: Certified Registered Nurse Anesthetist

## 2017-09-11 DIAGNOSIS — Z9889 Other specified postprocedural states: Secondary | ICD-10-CM

## 2017-09-11 DIAGNOSIS — R112 Nausea with vomiting, unspecified: Secondary | ICD-10-CM | POA: Diagnosis not present

## 2017-09-11 DIAGNOSIS — J9859 Other diseases of mediastinum, not elsewhere classified: Secondary | ICD-10-CM

## 2017-09-11 DIAGNOSIS — I440 Atrioventricular block, first degree: Secondary | ICD-10-CM | POA: Diagnosis present

## 2017-09-11 DIAGNOSIS — E890 Postprocedural hypothyroidism: Secondary | ICD-10-CM | POA: Diagnosis present

## 2017-09-11 DIAGNOSIS — E8770 Fluid overload, unspecified: Secondary | ICD-10-CM | POA: Diagnosis not present

## 2017-09-11 DIAGNOSIS — E785 Hyperlipidemia, unspecified: Secondary | ICD-10-CM | POA: Diagnosis not present

## 2017-09-11 DIAGNOSIS — J90 Pleural effusion, not elsewhere classified: Secondary | ICD-10-CM | POA: Diagnosis not present

## 2017-09-11 DIAGNOSIS — E049 Nontoxic goiter, unspecified: Principal | ICD-10-CM

## 2017-09-11 DIAGNOSIS — K5900 Constipation, unspecified: Secondary | ICD-10-CM | POA: Diagnosis not present

## 2017-09-11 DIAGNOSIS — R51 Headache: Secondary | ICD-10-CM | POA: Diagnosis not present

## 2017-09-11 DIAGNOSIS — D62 Acute posthemorrhagic anemia: Secondary | ICD-10-CM | POA: Diagnosis not present

## 2017-09-11 DIAGNOSIS — J939 Pneumothorax, unspecified: Secondary | ICD-10-CM | POA: Diagnosis not present

## 2017-09-11 DIAGNOSIS — Z09 Encounter for follow-up examination after completed treatment for conditions other than malignant neoplasm: Secondary | ICD-10-CM

## 2017-09-11 DIAGNOSIS — I10 Essential (primary) hypertension: Secondary | ICD-10-CM | POA: Diagnosis present

## 2017-09-11 DIAGNOSIS — R0789 Other chest pain: Secondary | ICD-10-CM | POA: Diagnosis not present

## 2017-09-11 DIAGNOSIS — Z01818 Encounter for other preprocedural examination: Secondary | ICD-10-CM

## 2017-09-11 HISTORY — PX: STERNOTOMY: SHX1057

## 2017-09-11 HISTORY — PX: RESECTION OF MEDIASTINAL MASS: SHX6497

## 2017-09-11 LAB — PULMONARY FUNCTION TEST
FEF 25-75 Pre: 1.7 L/sec
FEF2575-%Pred-Pre: 101 %
FEV1-%Pred-Pre: 93 %
FEV1-Pre: 1.67 L
FEV1FVC-%Pred-Pre: 101 %
FEV6-%Pred-Pre: 95 %
FEV6-Pre: 2.1 L
FEV6FVC-%Pred-Pre: 104 %
FVC-%Pred-Pre: 91 %
FVC-Pre: 2.1 L
Pre FEV1/FVC ratio: 80 %
Pre FEV6/FVC Ratio: 100 %

## 2017-09-11 LAB — PREPARE RBC (CROSSMATCH)

## 2017-09-11 LAB — TYPE AND SCREEN
ABO/RH(D): B POS
Antibody Screen: NEGATIVE
Unit division: 0

## 2017-09-11 LAB — BPAM RBC
Blood Product Expiration Date: 201909052359
Unit Type and Rh: 7300

## 2017-09-11 SURGERY — STERNOTOMY
Anesthesia: General | Site: Chest

## 2017-09-11 MED ORDER — ACETAMINOPHEN 160 MG/5ML PO SOLN: 1000.0000 mg | Freq: Four times a day (QID) | ORAL | Status: DC

## 2017-09-11 MED ORDER — LIDOCAINE 2% (20 MG/ML) 5 ML SYRINGE
INTRAMUSCULAR | Status: DC | PRN
  Administered 2017-09-11: 60 mg via INTRAVENOUS

## 2017-09-11 MED ORDER — PHENYLEPHRINE 40 MCG/ML (10ML) SYRINGE FOR IV PUSH (FOR BLOOD PRESSURE SUPPORT)
PREFILLED_SYRINGE | INTRAVENOUS | Status: AC
  Filled 2017-09-11: qty 10

## 2017-09-11 MED ORDER — LISINOPRIL 10 MG PO TABS
20.0000 mg | ORAL_TABLET | Freq: Every day | ORAL | Status: DC
  Administered 2017-09-12 – 2017-09-15 (×4): 20 mg via ORAL
  Filled 2017-09-11 (×2): qty 1
  Filled 2017-09-11 (×2): qty 2

## 2017-09-11 MED ORDER — BISACODYL 5 MG PO TBEC
10.0000 mg | DELAYED_RELEASE_TABLET | Freq: Every day | ORAL | Status: DC
  Administered 2017-09-11 – 2017-09-14 (×4): 10 mg via ORAL
  Filled 2017-09-11 (×4): qty 2

## 2017-09-11 MED ORDER — HEMOSTATIC AGENTS (NO CHARGE) OPTIME
TOPICAL | Status: DC | PRN
  Administered 2017-09-11 (×4): 1 via TOPICAL

## 2017-09-11 MED ORDER — LISINOPRIL-HYDROCHLOROTHIAZIDE 20-12.5 MG PO TABS: 1.0000 | ORAL_TABLET | Freq: Every day | ORAL | Status: DC

## 2017-09-11 MED ORDER — ROCURONIUM BROMIDE 10 MG/ML (PF) SYRINGE
PREFILLED_SYRINGE | INTRAVENOUS | Status: DC | PRN
  Administered 2017-09-11 (×2): 50 mg via INTRAVENOUS

## 2017-09-11 MED ORDER — HYDRALAZINE HCL 20 MG/ML IJ SOLN
10.0000 mg | INTRAMUSCULAR | Status: DC | PRN
  Administered 2017-09-11: 10 mg via INTRAVENOUS
  Filled 2017-09-11: qty 1

## 2017-09-11 MED ORDER — VANCOMYCIN HCL IN DEXTROSE 1-5 GM/200ML-% IV SOLN
1000.0000 mg | Freq: Two times a day (BID) | INTRAVENOUS | Status: AC
  Administered 2017-09-11: 1000 mg via INTRAVENOUS
  Filled 2017-09-11: qty 200

## 2017-09-11 MED ORDER — LABETALOL HCL 5 MG/ML IV SOLN
INTRAVENOUS | Status: AC
  Filled 2017-09-11: qty 4

## 2017-09-11 MED ORDER — ONDANSETRON HCL 4 MG/2ML IJ SOLN
INTRAMUSCULAR | Status: DC | PRN
  Administered 2017-09-11: 4 mg via INTRAVENOUS

## 2017-09-11 MED ORDER — OXYCODONE HCL 5 MG PO TABS
5.0000 mg | ORAL_TABLET | ORAL | Status: DC | PRN
  Administered 2017-09-11 – 2017-09-12 (×3): 10 mg via ORAL
  Administered 2017-09-13: 5 mg via ORAL
  Administered 2017-09-13: 10 mg via ORAL
  Filled 2017-09-11 (×5): qty 2
  Filled 2017-09-11: qty 1

## 2017-09-11 MED ORDER — METOPROLOL TARTRATE 25 MG PO TABS
25.0000 mg | ORAL_TABLET | Freq: Every day | ORAL | Status: DC
  Administered 2017-09-12 – 2017-09-15 (×4): 25 mg via ORAL
  Filled 2017-09-11 (×4): qty 1

## 2017-09-11 MED ORDER — 0.9 % SODIUM CHLORIDE (POUR BTL) OPTIME
TOPICAL | Status: DC | PRN
  Administered 2017-09-11: 2000 mL

## 2017-09-11 MED ORDER — HEMOSTATIC AGENTS (NO CHARGE) OPTIME
TOPICAL | Status: DC | PRN
  Administered 2017-09-11: 1 via TOPICAL

## 2017-09-11 MED ORDER — LABETALOL HCL 5 MG/ML IV SOLN
5.0000 mg | INTRAVENOUS | Status: DC | PRN
  Administered 2017-09-11 (×2): 5 mg via INTRAVENOUS

## 2017-09-11 MED ORDER — ONDANSETRON HCL 4 MG/2ML IJ SOLN: 4.0000 mg | Freq: Once | INTRAMUSCULAR | Status: DC | PRN

## 2017-09-11 MED ORDER — DEXTROSE-NACL 5-0.9 % IV SOLN
INTRAVENOUS | Status: DC
  Administered 2017-09-11 – 2017-09-12 (×3): via INTRAVENOUS

## 2017-09-11 MED ORDER — FENTANYL CITRATE (PF) 100 MCG/2ML IJ SOLN: 25.0000 ug | INTRAMUSCULAR | Status: DC | PRN

## 2017-09-11 MED ORDER — MIDAZOLAM HCL 2 MG/2ML IJ SOLN
INTRAMUSCULAR | Status: AC
  Filled 2017-09-11: qty 2

## 2017-09-11 MED ORDER — LEVOTHYROXINE SODIUM 75 MCG PO TABS
75.0000 ug | ORAL_TABLET | ORAL | Status: DC
  Administered 2017-09-12 – 2017-09-14 (×3): 75 ug via ORAL
  Filled 2017-09-11 (×3): qty 1

## 2017-09-11 MED ORDER — PROPOFOL 10 MG/ML IV BOLUS
INTRAVENOUS | Status: AC
  Filled 2017-09-11: qty 20

## 2017-09-11 MED ORDER — FENTANYL CITRATE (PF) 250 MCG/5ML IJ SOLN
INTRAMUSCULAR | Status: AC
  Filled 2017-09-11: qty 10

## 2017-09-11 MED ORDER — ROCURONIUM BROMIDE 50 MG/5ML IV SOSY
PREFILLED_SYRINGE | INTRAVENOUS | Status: AC
  Filled 2017-09-11: qty 10

## 2017-09-11 MED ORDER — LACTATED RINGERS IV SOLN
INTRAVENOUS | Status: DC | PRN
  Administered 2017-09-11: 15:00:00 via INTRAVENOUS

## 2017-09-11 MED ORDER — SCOPOLAMINE 1 MG/3DAYS TD PT72
MEDICATED_PATCH | TRANSDERMAL | Status: AC
  Filled 2017-09-11: qty 1

## 2017-09-11 MED ORDER — CYCLOSPORINE 0.05 % OP EMUL
1.0000 [drp] | Freq: Every day | OPHTHALMIC | Status: DC
  Administered 2017-09-12 – 2017-09-15 (×4): 1 [drp] via OPHTHALMIC
  Filled 2017-09-11 (×4): qty 1

## 2017-09-11 MED ORDER — VANCOMYCIN HCL 1000 MG IV SOLR
INTRAVENOUS | Status: DC | PRN
  Administered 2017-09-11: 1000 mg via INTRAVENOUS

## 2017-09-11 MED ORDER — ONDANSETRON HCL 4 MG/2ML IJ SOLN
INTRAMUSCULAR | Status: AC
  Filled 2017-09-11: qty 2

## 2017-09-11 MED ORDER — ESTROGENS, CONJUGATED 0.625 MG/GM VA CREA
1.0000 | TOPICAL_CREAM | VAGINAL | Status: DC
  Administered 2017-09-13: 1 via VAGINAL
  Filled 2017-09-11 (×2): qty 30

## 2017-09-11 MED ORDER — PROPOFOL 10 MG/ML IV BOLUS
INTRAVENOUS | Status: DC | PRN
  Administered 2017-09-11: 110 mg via INTRAVENOUS

## 2017-09-11 MED ORDER — ACETAMINOPHEN 500 MG PO TABS
1000.0000 mg | ORAL_TABLET | Freq: Four times a day (QID) | ORAL | Status: DC
  Administered 2017-09-11 – 2017-09-15 (×15): 1000 mg via ORAL
  Filled 2017-09-11 (×15): qty 2

## 2017-09-11 MED ORDER — TRAMADOL HCL 50 MG PO TABS
50.0000 mg | ORAL_TABLET | Freq: Four times a day (QID) | ORAL | Status: DC | PRN
  Administered 2017-09-11 – 2017-09-14 (×8): 100 mg via ORAL
  Filled 2017-09-11 (×8): qty 2

## 2017-09-11 MED ORDER — KETOROLAC TROMETHAMINE 30 MG/ML IJ SOLN
INTRAMUSCULAR | Status: AC
  Filled 2017-09-11: qty 1

## 2017-09-11 MED ORDER — LIDOCAINE 2% (20 MG/ML) 5 ML SYRINGE
INTRAMUSCULAR | Status: AC
  Filled 2017-09-11: qty 5

## 2017-09-11 MED ORDER — ONDANSETRON HCL 4 MG/2ML IJ SOLN
4.0000 mg | Freq: Four times a day (QID) | INTRAMUSCULAR | Status: DC | PRN
  Administered 2017-09-12 – 2017-09-14 (×3): 4 mg via INTRAVENOUS
  Filled 2017-09-11 (×3): qty 2

## 2017-09-11 MED ORDER — SUGAMMADEX SODIUM 200 MG/2ML IV SOLN
INTRAVENOUS | Status: DC | PRN
  Administered 2017-09-11: 400 mg via INTRAVENOUS

## 2017-09-11 MED ORDER — MIDAZOLAM HCL 2 MG/2ML IJ SOLN
INTRAMUSCULAR | Status: DC | PRN
  Administered 2017-09-11: 2 mg via INTRAVENOUS

## 2017-09-11 MED ORDER — VALACYCLOVIR HCL 500 MG PO TABS
500.0000 mg | ORAL_TABLET | Freq: Every day | ORAL | Status: DC | PRN
  Filled 2017-09-11: qty 1

## 2017-09-11 MED ORDER — VANCOMYCIN HCL IN DEXTROSE 1-5 GM/200ML-% IV SOLN
1000.0000 mg | INTRAVENOUS | Status: DC
  Filled 2017-09-11: qty 200

## 2017-09-11 MED ORDER — SODIUM CHLORIDE 0.9 % IV SOLN
INTRAVENOUS | Status: DC | PRN
  Administered 2017-09-11: 20 ug/min via INTRAVENOUS

## 2017-09-11 MED ORDER — PHENYLEPHRINE 40 MCG/ML (10ML) SYRINGE FOR IV PUSH (FOR BLOOD PRESSURE SUPPORT)
PREFILLED_SYRINGE | INTRAVENOUS | Status: DC | PRN
  Administered 2017-09-11 (×3): 80 ug via INTRAVENOUS

## 2017-09-11 MED ORDER — DEXAMETHASONE SODIUM PHOSPHATE 10 MG/ML IJ SOLN
INTRAMUSCULAR | Status: DC | PRN
  Administered 2017-09-11: 10 mg via INTRAVENOUS

## 2017-09-11 MED ORDER — HYDROCHLOROTHIAZIDE 12.5 MG PO CAPS
12.5000 mg | ORAL_CAPSULE | Freq: Every day | ORAL | Status: DC
  Administered 2017-09-12 – 2017-09-15 (×4): 12.5 mg via ORAL
  Filled 2017-09-11 (×4): qty 1

## 2017-09-11 MED ORDER — FENTANYL CITRATE (PF) 100 MCG/2ML IJ SOLN
25.0000 ug | INTRAMUSCULAR | Status: DC | PRN
  Administered 2017-09-11: 25 ug via INTRAVENOUS
  Administered 2017-09-12: 50 ug via INTRAVENOUS
  Filled 2017-09-11 (×2): qty 2

## 2017-09-11 MED ORDER — LACTATED RINGERS IV SOLN
INTRAVENOUS | Status: DC
  Administered 2017-09-11: 14:00:00 via INTRAVENOUS

## 2017-09-11 MED ORDER — KETOROLAC TROMETHAMINE 30 MG/ML IJ SOLN
INTRAMUSCULAR | Status: DC | PRN
  Administered 2017-09-11: 30 mg via INTRAVENOUS

## 2017-09-11 MED ORDER — LEVOTHYROXINE SODIUM 50 MCG PO TABS
50.0000 ug | ORAL_TABLET | ORAL | Status: DC
  Administered 2017-09-15: 50 ug via ORAL
  Filled 2017-09-11: qty 1

## 2017-09-11 MED ORDER — ALBUTEROL SULFATE (2.5 MG/3ML) 0.083% IN NEBU
2.5000 mg | INHALATION_SOLUTION | RESPIRATORY_TRACT | Status: DC
  Administered 2017-09-11 – 2017-09-12 (×2): 2.5 mg via RESPIRATORY_TRACT
  Filled 2017-09-11 (×2): qty 3

## 2017-09-11 MED ORDER — FENTANYL CITRATE (PF) 250 MCG/5ML IJ SOLN
INTRAMUSCULAR | Status: DC | PRN
  Administered 2017-09-11 (×3): 100 ug via INTRAVENOUS
  Administered 2017-09-11: 50 ug via INTRAVENOUS
  Administered 2017-09-11: 100 ug via INTRAVENOUS

## 2017-09-11 MED ORDER — SENNOSIDES-DOCUSATE SODIUM 8.6-50 MG PO TABS
1.0000 | ORAL_TABLET | Freq: Every day | ORAL | Status: DC
  Administered 2017-09-11 – 2017-09-14 (×4): 1 via ORAL
  Filled 2017-09-11 (×4): qty 1

## 2017-09-11 MED ORDER — POTASSIUM CHLORIDE 10 MEQ/50ML IV SOLN
10.0000 meq | Freq: Every day | INTRAVENOUS | Status: DC | PRN
  Filled 2017-09-11: qty 50

## 2017-09-11 MED ORDER — SCOPOLAMINE 1 MG/3DAYS TD PT72
MEDICATED_PATCH | TRANSDERMAL | Status: DC | PRN
  Administered 2017-09-11: 1 via TRANSDERMAL

## 2017-09-11 MED ORDER — DEXAMETHASONE SODIUM PHOSPHATE 10 MG/ML IJ SOLN
INTRAMUSCULAR | Status: AC
  Filled 2017-09-11: qty 1

## 2017-09-11 SURGICAL SUPPLY — 99 items
ADAPTER CARDIO PERF ANTE/RETRO (ADAPTER) IMPLANT
BAG DECANTER FOR FLEXI CONT (MISCELLANEOUS) IMPLANT
BANDAGE ACE 4X5 VEL STRL LF (GAUZE/BANDAGES/DRESSINGS) IMPLANT
BANDAGE ACE 6X5 VEL STRL LF (GAUZE/BANDAGES/DRESSINGS) IMPLANT
BASKET HEART  (ORDER IN 25'S) (MISCELLANEOUS)
BASKET HEART (ORDER IN 25'S) (MISCELLANEOUS)
BASKET HEART (ORDER IN 25S) (MISCELLANEOUS) IMPLANT
BLADE CLIPPER SURG (BLADE) IMPLANT
BLADE STERNUM SYSTEM 6 (BLADE) ×3 IMPLANT
BLADE SURG 12 STRL SS (BLADE) IMPLANT
BNDG GAUZE ELAST 4 BULKY (GAUZE/BANDAGES/DRESSINGS) IMPLANT
CANISTER SUCT 3000ML PPV (MISCELLANEOUS) ×3 IMPLANT
CANNULA GUNDRY RCSP 15FR (MISCELLANEOUS) IMPLANT
CATH CPB KIT VANTRIGT (MISCELLANEOUS) IMPLANT
CATH ROBINSON RED A/P 18FR (CATHETERS) IMPLANT
CATH THORACIC 36FR RT ANG (CATHETERS) IMPLANT
CLIP VESOCCLUDE MED 24/CT (CLIP) ×3 IMPLANT
CLIP VESOCCLUDE SM WIDE 24/CT (CLIP) ×3 IMPLANT
CONN ST 1/4X3/8  BEN (MISCELLANEOUS) ×2
CONN ST 1/4X3/8 BEN (MISCELLANEOUS) ×1 IMPLANT
CRADLE DONUT ADULT HEAD (MISCELLANEOUS) ×6 IMPLANT
DRAIN CHANNEL 28F RND 3/8 FF (WOUND CARE) ×3 IMPLANT
DRAIN CHANNEL 32F RND 10.7 FF (WOUND CARE) IMPLANT
DRAPE CARDIOVASCULAR INCISE (DRAPES) ×2
DRAPE SLUSH/WARMER DISC (DRAPES) ×3 IMPLANT
DRAPE SRG 135X102X78XABS (DRAPES) ×1 IMPLANT
DRSG AQUACEL AG ADV 3.5X14 (GAUZE/BANDAGES/DRESSINGS) ×3 IMPLANT
ELECT BLADE 4.0 EZ CLEAN MEGAD (MISCELLANEOUS) ×3
ELECT BLADE 6.5 EXT (BLADE) IMPLANT
ELECT CAUTERY BLADE 6.4 (BLADE) IMPLANT
ELECT REM PT RETURN 9FT ADLT (ELECTROSURGICAL) ×3
ELECTRODE BLDE 4.0 EZ CLN MEGD (MISCELLANEOUS) ×1 IMPLANT
ELECTRODE REM PT RTRN 9FT ADLT (ELECTROSURGICAL) ×1 IMPLANT
FELT TEFLON 1X6 (MISCELLANEOUS) ×3 IMPLANT
GAUZE SPONGE 4X4 12PLY STRL (GAUZE/BANDAGES/DRESSINGS) ×3 IMPLANT
GAUZE SPONGE 4X4 12PLY STRL LF (GAUZE/BANDAGES/DRESSINGS) ×3 IMPLANT
GLOVE BIO SURGEON STRL SZ 6.5 (GLOVE) ×2 IMPLANT
GLOVE BIO SURGEON STRL SZ7.5 (GLOVE) ×9 IMPLANT
GLOVE BIO SURGEONS STRL SZ 6.5 (GLOVE) ×1
GLOVE BIOGEL PI IND STRL 8.5 (GLOVE) ×1 IMPLANT
GLOVE BIOGEL PI INDICATOR 8.5 (GLOVE) ×2
GOWN STRL REUS W/ TWL LRG LVL3 (GOWN DISPOSABLE) ×5 IMPLANT
GOWN STRL REUS W/TWL LRG LVL3 (GOWN DISPOSABLE) ×10
HEMOSTAT POWDER SURGIFOAM 1G (HEMOSTASIS) ×9 IMPLANT
HEMOSTAT SURGICEL 2X14 (HEMOSTASIS) ×3 IMPLANT
INSERT FOGARTY XLG (MISCELLANEOUS) IMPLANT
KIT BASIN OR (CUSTOM PROCEDURE TRAY) ×3 IMPLANT
KIT SUCTION CATH 14FR (SUCTIONS) IMPLANT
KIT TURNOVER KIT B (KITS) ×3 IMPLANT
KIT VASOVIEW HEMOPRO VH 3000 (KITS) IMPLANT
LEAD PACING MYOCARDI (MISCELLANEOUS) IMPLANT
MARKER GRAFT CORONARY BYPASS (MISCELLANEOUS) IMPLANT
NS IRRIG 1000ML POUR BTL (IV SOLUTION) ×6 IMPLANT
PACK CHEST (CUSTOM PROCEDURE TRAY) ×3 IMPLANT
PACK E OPEN HEART (SUTURE) IMPLANT
PACK OPEN HEART (CUSTOM PROCEDURE TRAY) IMPLANT
PAD ARMBOARD 7.5X6 YLW CONV (MISCELLANEOUS) ×6 IMPLANT
PAD ELECT DEFIB RADIOL ZOLL (MISCELLANEOUS) ×3 IMPLANT
PENCIL BUTTON HOLSTER BLD 10FT (ELECTRODE) IMPLANT
PUNCH AORTIC ROTATE 4.0MM (MISCELLANEOUS) IMPLANT
PUNCH AORTIC ROTATE 4.5MM 8IN (MISCELLANEOUS) IMPLANT
PUNCH AORTIC ROTATE 5MM 8IN (MISCELLANEOUS) IMPLANT
SPONGE LAP 18X18 X RAY DECT (DISPOSABLE) ×3 IMPLANT
SURGIFLO W/THROMBIN 8M KIT (HEMOSTASIS) ×3 IMPLANT
SUT BONE WAX W31G (SUTURE) ×3 IMPLANT
SUT MNCRL AB 4-0 PS2 18 (SUTURE) IMPLANT
SUT PROLENE 3 0 SH DA (SUTURE) IMPLANT
SUT PROLENE 3 0 SH1 36 (SUTURE) IMPLANT
SUT PROLENE 4 0 RB 1 (SUTURE)
SUT PROLENE 4 0 SH DA (SUTURE) ×6 IMPLANT
SUT PROLENE 4-0 RB1 .5 CRCL 36 (SUTURE) IMPLANT
SUT PROLENE 5 0 C 1 36 (SUTURE) IMPLANT
SUT PROLENE 6 0 C 1 30 (SUTURE) IMPLANT
SUT PROLENE 6 0 CC (SUTURE) IMPLANT
SUT PROLENE 8 0 BV175 6 (SUTURE) IMPLANT
SUT PROLENE BLUE 7 0 (SUTURE) IMPLANT
SUT SILK  1 MH (SUTURE) ×2
SUT SILK 1 MH (SUTURE) ×1 IMPLANT
SUT SILK 2 0 SH CR/8 (SUTURE) IMPLANT
SUT SILK 2 0SH CR/8 30 (SUTURE) ×3 IMPLANT
SUT SILK 3 0 SH CR/8 (SUTURE) IMPLANT
SUT STEEL 6MS V (SUTURE) IMPLANT
SUT STEEL SZ 6 DBL 3X14 BALL (SUTURE) IMPLANT
SUT VIC AB 1 CTX 36 (SUTURE) ×4
SUT VIC AB 1 CTX36XBRD ANBCTR (SUTURE) ×2 IMPLANT
SUT VIC AB 2-0 CT1 27 (SUTURE)
SUT VIC AB 2-0 CT1 TAPERPNT 27 (SUTURE) IMPLANT
SUT VIC AB 2-0 CTX 27 (SUTURE) IMPLANT
SUT VIC AB 2-0 CTX 36 (SUTURE) ×6 IMPLANT
SUT VIC AB 3-0 X1 27 (SUTURE) ×6 IMPLANT
SYR 5ML LUER SLIP (SYRINGE) IMPLANT
SYSTEM SAHARA CHEST DRAIN ATS (WOUND CARE) ×3 IMPLANT
TAPE CLOTH SURG 4X10 WHT LF (GAUZE/BANDAGES/DRESSINGS) ×3 IMPLANT
TOWEL GREEN STERILE (TOWEL DISPOSABLE) IMPLANT
TOWEL GREEN STERILE FF (TOWEL DISPOSABLE) ×3 IMPLANT
TRAY FOLEY SLVR 16FR TEMP STAT (SET/KITS/TRAYS/PACK) ×3 IMPLANT
TUBING INSUFFLATION (TUBING) IMPLANT
UNDERPAD 30X30 (UNDERPADS AND DIAPERS) IMPLANT
WATER STERILE IRR 1000ML POUR (IV SOLUTION) ×3 IMPLANT

## 2017-09-11 NOTE — Progress Notes (Signed)
Patient ID: [name], female   DOB: 09/22/1950, 67 y.o.   MRN: [id number] TCTS Evening Rounds:  Hemodynamically stable but hypertensive in sinus rhythm. Received some pain meds.  Antihypertensives to start tomorrow. Will add some prn hydralazine if needed tonight.  sats 99%

## 2017-09-11 NOTE — Transfer of Care (Signed)
Immediate Anesthesia Transfer of Care Note  Patient: [name]  Procedure(s) Performed: STERNOTOMY (N/A Chest) RESECTION OF MEDIASTINAL MASS (N/A Chest)  Patient Location: PACU  Anesthesia Type:General  Level of Consciousness: awake, alert  and oriented  Airway & Oxygen Therapy: Patient Spontanous Breathing and Patient connected to face mask oxygen  Post-op Assessment: Report given to RN and Post -op Vital signs reviewed and stable  Post vital signs: Reviewed and stable  Last Vitals:  Vitals Value Taken Time  BP 177/79 09/11/2017  4:57 PM  Temp 36.2 C 09/11/2017  4:57 PM  Pulse 66 09/11/2017  5:02 PM  Resp 14 09/11/2017  5:02 PM  SpO2 100 % 09/11/2017  5:02 PM  Vitals shown include unvalidated device data.  Last Pain:  Vitals:   09/11/17 1221  TempSrc: Oral  PainSc: 0-No pain      Patients Stated Pain Goal: 4 (43/88/87 5797)  Complications: No apparent anesthesia complications

## 2017-09-11 NOTE — Anesthesia Procedure Notes (Signed)
Central Venous Catheter Insertion Performed by: Roberts Gaudy, MD, anesthesiologist Start/End9/03/2017 2:58 PM, 09/11/2017 3:06 PM Patient location: OR. Preanesthetic checklist: patient identified, IV checked, site marked, risks and benefits discussed, surgical consent, monitors and equipment checked, pre-op evaluation, timeout performed and anesthesia consent Lidocaine 1% used for infiltration and patient sedated Hand hygiene performed  and maximum sterile barriers used  Catheter size: 8 Fr Total catheter length 16. Central line was placed.Double lumen Procedure performed using ultrasound guided technique. Ultrasound Notes:image(s) printed for medical record Attempts: 1 Following insertion, dressing applied and line sutured. Post procedure assessment: blood return through all ports  Patient tolerated the procedure well with no immediate complications.
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
## 2017-09-11 NOTE — Anesthesia Procedure Notes (Signed)
Procedure Name: Intubation Date/Time: 09/11/2017 3:17 PM Performed by: Teressa Lower., CRNA Pre-anesthesia Checklist: Patient identified, Emergency Drugs available, Suction available and Patient being monitored Patient Re-evaluated:Patient Re-evaluated prior to induction Oxygen Delivery Method: Circle system utilized Preoxygenation: Pre-oxygenation with 100% oxygen Induction Type: IV induction Ventilation: Mask ventilation without difficulty Laryngoscope Size: Miller and 2 Grade View: Grade I Tube type: Oral Tube size: 7.0 mm Number of attempts: 1 Airway Equipment and Method: Stylet and Oral airway Placement Confirmation: ETT inserted through vocal cords under direct vision,  positive ETCO2 and breath sounds checked- equal and bilateral Secured at: 21 cm Tube secured with: Tape Dental Injury: Teeth and Oropharynx as per pre-operative assessment

## 2017-09-11 NOTE — Progress Notes (Signed)
Pre Procedure note for inpatients:   [name] has been scheduled for Procedure(s): STERNOTOMY (N/A) RESECTION OF MEDIASTINAL MASS (N/A) today. The various methods of treatment have been discussed with the patient. After consideration of the risks, benefits and treatment options the patient has consented to the planned procedure.   The patient has been seen and labs reviewed. There are no changes in the patient's condition to prevent proceeding with the planned procedure today.  Recent labs:  Lab Results  Component Value Date   WBC 5.8 09/07/2017   HGB 12.7 09/07/2017   HCT 40.0 09/07/2017   PLT 247 09/07/2017   GLUCOSE 102 (H) 09/07/2017   ALT 16 09/07/2017   AST 19 09/07/2017   NA 140 09/07/2017   K 3.8 09/07/2017   CL 106 09/07/2017   CREATININE 0.93 09/07/2017   BUN 19 09/07/2017   CO2 24 09/07/2017   INR 0.96 09/07/2017    Len Childs, MD 09/11/2017 2:02 PM
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
## 2017-09-11 NOTE — Brief Op Note (Signed)
09/11/2017  4:46 PM  PATIENT:  [name]  67 y.o. female  PRE-OPERATIVE DIAGNOSIS:  mediastinal mass  POST-OPERATIVE DIAGNOSIS:  mediastinal mass  PROCEDURE:  Procedure(s): STERNOTOMY (N/A) RESECTION OF MEDIASTINAL MASS (N/A)  SURGEON:  Surgeon(s) and Role:    Ivin Poot, MD - Primary  PHYSICIAN ASSISTANT: WAYNE GOLD PA-C  ANESTHESIA:   general  EBL:  100 mL   BLOOD ADMINISTERED:none  DRAINS: 1 CHEST TUBE IN THE MEDIASTINUM   LOCAL MEDICATIONS USED:  NONE  SPECIMEN:  Source of Specimen:  MEDIASTINAL MASS, FROZEN- THYTROID TISSUE  DISPOSITION OF SPECIMEN:  PATHOLOGY  COUNTS:  YES  TOURNIQUET:  * No tourniquets in log *  DICTATION: .Other Dictation: Dictation Number [id number]  PLAN OF CARE: Admit to inpatient   PATIENT DISPOSITION:  PACU - hemodynamically stable.   Delay start of Pharmacological VTE agent (>24hrs) due to surgical blood loss or risk of bleeding: yes  COMPLICATIONS: NO KNOWN

## 2017-09-11 NOTE — Anesthesia Preprocedure Evaluation (Addendum)
Anesthesia Evaluation  Patient identified by MRN, date of birth, ID band Patient awake    Reviewed: Allergy & Precautions, NPO status , Patient's Chart, lab work & pertinent test results  Airway Mallampati: II  TM Distance: >3 FB     Dental  (+) Teeth Intact, Dental Advisory Given   Pulmonary    breath sounds clear to auscultation       Cardiovascular hypertension,  Rhythm:Regular Rate:Normal     Neuro/Psych    GI/Hepatic   Endo/Other    Renal/GU      Musculoskeletal   Abdominal   Peds  Hematology   Anesthesia Other Findings   Reproductive/Obstetrics                             Anesthesia Physical Anesthesia Plan  ASA: III  Anesthesia Plan: General   Post-op Pain Management:    Induction: Intravenous  PONV Risk Score and Plan: Ondansetron and Dexamethasone  Airway Management Planned: Oral ETT  Additional Equipment: Arterial line and CVP  Intra-op Plan:   Post-operative Plan: Possible Post-op intubation/ventilation  Informed Consent: I have reviewed the patients History and Physical, chart, labs and discussed the procedure including the risks, benefits and alternatives for the proposed anesthesia with the patient or authorized representative who has indicated his/her understanding and acceptance.   Dental advisory given  Plan Discussed with: CRNA and Anesthesiologist  Anesthesia Plan Comments:         Anesthesia Quick Evaluation

## 2017-09-11 NOTE — Anesthesia Procedure Notes (Signed)
Arterial Line Insertion Start/End9/03/2017 1:30 PM, 09/11/2017 1:35 PM Performed by: Mariea Clonts, CRNA  Patient location: Pre-op. Preanesthetic checklist: patient identified, IV checked, site marked, risks and benefits discussed, surgical consent, monitors and equipment checked, pre-op evaluation, timeout performed and anesthesia consent Lidocaine 1% used for infiltration Left, radial was placed Catheter size: 20 G Hand hygiene performed  and maximum sterile barriers used   Attempts: 1 Procedure performed without using ultrasound guided technique. Following insertion, dressing applied and Biopatch. Post procedure assessment: normal and unchanged  Patient tolerated the procedure well with no immediate complications.

## 2017-09-12 ENCOUNTER — Encounter (HOSPITAL_COMMUNITY): Payer: Self-pay | Admitting: Cardiothoracic Surgery

## 2017-09-12 ENCOUNTER — Inpatient Hospital Stay (HOSPITAL_COMMUNITY): Payer: Medicare HMO

## 2017-09-12 LAB — POCT I-STAT 3, ART BLOOD GAS (G3+)
Acid-base deficit: 4 mmol/L — ABNORMAL HIGH (ref 0.0–2.0)
Bicarbonate: 21.8 mmol/L (ref 20.0–28.0)
O2 Saturation: 96 %
Patient temperature: 98.6
TCO2: 23 mmol/L (ref 22–32)
pCO2 arterial: 42.2 mmHg (ref 32.0–48.0)
pH, Arterial: 7.322 — ABNORMAL LOW (ref 7.350–7.450)
pO2, Arterial: 91 mmHg (ref 83.0–108.0)

## 2017-09-12 LAB — CBC
HCT: 32.6 % — ABNORMAL LOW (ref 36.0–46.0)
Hemoglobin: 10.6 g/dL — ABNORMAL LOW (ref 12.0–15.0)
MCH: 29.9 pg (ref 26.0–34.0)
MCHC: 32.5 g/dL (ref 30.0–36.0)
MCV: 92.1 fL (ref 78.0–100.0)
Platelets: 227 10*3/uL (ref 150–400)
RBC: 3.54 MIL/uL — ABNORMAL LOW (ref 3.87–5.11)
RDW: 13.3 % (ref 11.5–15.5)
WBC: 11.5 10*3/uL — ABNORMAL HIGH (ref 4.0–10.5)

## 2017-09-12 LAB — BASIC METABOLIC PANEL
Anion gap: 8 (ref 5–15)
BUN: 13 mg/dL (ref 8–23)
CO2: 23 mmol/L (ref 22–32)
Calcium: 7.4 mg/dL — ABNORMAL LOW (ref 8.9–10.3)
Chloride: 106 mmol/L (ref 98–111)
Creatinine, Ser: 1.03 mg/dL — ABNORMAL HIGH (ref 0.44–1.00)
GFR calc Af Amer: >60 mL/min (ref >60)
GFR calc non Af Amer: 55 mL/min — ABNORMAL LOW (ref >60)
Glucose, Bld: 238 mg/dL — ABNORMAL HIGH (ref 70–99)
Potassium: 3.9 mmol/L (ref 3.5–5.1)
Sodium: 137 mmol/L (ref 135–145)

## 2017-09-12 LAB — GLUCOSE, CAPILLARY
Glucose-Capillary: 131 mg/dL — ABNORMAL HIGH (ref 70–99)
Glucose-Capillary: 92 mg/dL (ref 70–99)

## 2017-09-12 MED ORDER — ENOXAPARIN SODIUM 30 MG/0.3ML ~~LOC~~ SOLN
30.0000 mg | SUBCUTANEOUS | Status: DC
  Administered 2017-09-12 – 2017-09-14 (×3): 30 mg via SUBCUTANEOUS
  Filled 2017-09-12 (×3): qty 0.3

## 2017-09-12 MED ORDER — FUROSEMIDE 10 MG/ML IJ SOLN
20.0000 mg | Freq: Every day | INTRAMUSCULAR | Status: AC
  Administered 2017-09-12 – 2017-09-13 (×2): 20 mg via INTRAVENOUS
  Filled 2017-09-12 (×2): qty 2

## 2017-09-12 MED ORDER — INSULIN ASPART 100 UNIT/ML ~~LOC~~ SOLN
0.0000 [IU] | Freq: Three times a day (TID) | SUBCUTANEOUS | Status: DC
  Administered 2017-09-12 – 2017-09-14 (×3): 2 [IU] via SUBCUTANEOUS

## 2017-09-12 MED ORDER — DEXTROSE-NACL 5-0.9 % IV SOLN: INTRAVENOUS | Status: DC

## 2017-09-12 MED ORDER — INSULIN ASPART 100 UNIT/ML ~~LOC~~ SOLN: 0.0000 [IU] | Freq: Every day | SUBCUTANEOUS | Status: DC

## 2017-09-12 NOTE — Progress Notes (Signed)
CT surgery p.m. Rounds  Patient up in chair, comfortable Ambulated in hallway Mediastinal chest tube removed Sinus rhythm Doing well postop sternotomy and resection of 5 cm mediastinal mass

## 2017-09-12 NOTE — Anesthesia Postprocedure Evaluation (Signed)
Anesthesia Post Note  Patient: [name]  Procedure(s) Performed: STERNOTOMY (N/A Chest) RESECTION OF MEDIASTINAL MASS (N/A Chest)     Patient location during evaluation: SICU Anesthesia Type: General Level of consciousness: awake, oriented and awake and alert Pain management: pain level controlled Vital Signs Assessment: post-procedure vital signs reviewed and stable Respiratory status: spontaneous breathing, respiratory function stable and nonlabored ventilation Cardiovascular status: blood pressure returned to baseline Anesthetic complications: no    Last Vitals:  Vitals:   09/12/17 0800 09/12/17 0807  BP: 130/65   Pulse: 91   Resp: (!) 21   Temp:  36.6 C  SpO2: 100%     Last Pain:  Vitals:   09/12/17 0807  TempSrc: Oral  PainSc:                  Carlen Rebuck COKER

## 2017-09-12 NOTE — Addendum Note (Signed)
Addendum  created 09/12/17 0859 by Roberts Gaudy, MD   Sign clinical note

## 2017-09-12 NOTE — Progress Notes (Signed)
Anesthesiology Follow-up:  67 year old female one day S/P resection od anterior mediastinal mass via median sternotomy. Extubated in OR following without difficulty.  VS: T- 36.6 BP- 130/55 HR 91 (SR) RR- 21 O2 Sat 98% on RA  Glucose- 238 BUN/Cr -13/1.03 K- 3.9 H/H- 10.6/32.6 Platelets- 227,000  Stable post-op course.  Roberts Gaudy

## 2017-09-12 NOTE — Progress Notes (Signed)
1 Day Post-Op Procedure(s) (LRB): STERNOTOMY (N/A) RESECTION OF MEDIASTINAL MASS (N/A) Subjective: Stable after sternotomy for resection benign 5 cm mediastinal mass CXR clear CBG > 200 Min chest tube output Objective: Vital signs in last 24 hours: Temp:  [97.2 F (36.2 C)-98.1 F (36.7 C)] 97.8 F (36.6 C) (09/04 0807) Pulse Rate:  [64-91] 91 (09/04 0800) Cardiac Rhythm: Normal sinus rhythm;Heart block (09/04 0800) Resp:  [13-27] 21 (09/04 0800) BP: (102-196)/(46-84) 130/65 (09/04 0800) SpO2:  [96 %-100 %] 100 % (09/04 0800) Arterial Line BP: (97-203)/(43-82) 132/60 (09/04 0800)  Hemodynamic parameters for last 24 hours:  nsr  Intake/Output from previous day: 09/03 0701 - 09/04 0700 In: 3074.8 [I.V.:2824.7; IV Piggyback:200.1] Out: 940 [Urine:710; Blood:100; Chest Tube:130] Intake/Output this shift: Total I/O In: 125 [I.V.:125] Out: -        Exam    General- alert and comfortable    Neck- no JVD, no cervical adenopathy palpable, no carotid bruit   Lungs- clear without rales, wheezes   Cor- regular rate and rhythm, no murmur , gallop   Abdomen- soft, non-tender   Extremities - warm, non-tender, minimal edema   Neuro- oriented, appropriate, no focal weakness   Lab Results: Recent Labs    09/12/17 0438  WBC 11.5*  HGB 10.6*  HCT 32.6*  PLT 227   BMET:  Recent Labs    09/12/17 0438  NA 137  K 3.9  CL 106  CO2 23  GLUCOSE 238*  BUN 13  CREATININE 1.03*  CALCIUM 7.4*    PT/INR: No results for input(s): LABPROT, INR in the last 72 hours. ABG    Component Value Date/Time   PHART 7.322 (L) 09/12/2017 0401   HCO3 21.8 09/12/2017 0401   TCO2 23 09/12/2017 0401   ACIDBASEDEF 4.0 (H) 09/12/2017 0401   O2SAT 96.0 09/12/2017 0401   CBG (last 3)  No results for input(s): GLUCAP in the last 72 hours.  Assessment/Plan: S/P Procedure(s) (LRB): STERNOTOMY (N/A) RESECTION OF MEDIASTINAL MASS (N/A) Mobilize Diuresis Diabetes control d/c  tubes/lines keep in ICU today   LOS: 1 day    Tharon Aquas Trigt III 09/12/2017

## 2017-09-13 ENCOUNTER — Inpatient Hospital Stay (HOSPITAL_COMMUNITY): Payer: Medicare HMO

## 2017-09-13 ENCOUNTER — Encounter (HOSPITAL_COMMUNITY): Payer: Self-pay | Admitting: *Deleted

## 2017-09-13 ENCOUNTER — Other Ambulatory Visit: Payer: Self-pay

## 2017-09-13 ENCOUNTER — Ambulatory Visit: Payer: Commercial Managed Care - HMO

## 2017-09-13 LAB — COMPREHENSIVE METABOLIC PANEL
ALT: 14 U/L (ref 0–44)
AST: 19 U/L (ref 15–41)
Albumin: 2.7 g/dL — ABNORMAL LOW (ref 3.5–5.0)
Alkaline Phosphatase: 68 U/L (ref 38–126)
Anion gap: 6 (ref 5–15)
BUN: 13 mg/dL (ref 8–23)
CO2: 29 mmol/L (ref 22–32)
Calcium: 7.4 mg/dL — ABNORMAL LOW (ref 8.9–10.3)
Chloride: 106 mmol/L (ref 98–111)
Creatinine, Ser: 0.98 mg/dL (ref 0.44–1.00)
GFR calc Af Amer: >60 mL/min (ref >60)
GFR calc non Af Amer: 58 mL/min — ABNORMAL LOW (ref >60)
Glucose, Bld: 123 mg/dL — ABNORMAL HIGH (ref 70–99)
Potassium: 3.7 mmol/L (ref 3.5–5.1)
Sodium: 141 mmol/L (ref 135–145)
Total Bilirubin: 0.6 mg/dL (ref 0.3–1.2)
Total Protein: 5.6 g/dL — ABNORMAL LOW (ref 6.5–8.1)

## 2017-09-13 LAB — CBC
HCT: 31.1 % — ABNORMAL LOW (ref 36.0–46.0)
Hemoglobin: 10 g/dL — ABNORMAL LOW (ref 12.0–15.0)
MCH: 30.2 pg (ref 26.0–34.0)
MCHC: 32.2 g/dL (ref 30.0–36.0)
MCV: 94 fL (ref 78.0–100.0)
Platelets: 204 10*3/uL (ref 150–400)
RBC: 3.31 MIL/uL — ABNORMAL LOW (ref 3.87–5.11)
RDW: 13.7 % (ref 11.5–15.5)
WBC: 11.6 10*3/uL — ABNORMAL HIGH (ref 4.0–10.5)

## 2017-09-13 LAB — GLUCOSE, CAPILLARY
Glucose-Capillary: 104 mg/dL — ABNORMAL HIGH (ref 70–99)
Glucose-Capillary: 114 mg/dL — ABNORMAL HIGH (ref 70–99)
Glucose-Capillary: 118 mg/dL — ABNORMAL HIGH (ref 70–99)
Glucose-Capillary: 130 mg/dL — ABNORMAL HIGH (ref 70–99)
Glucose-Capillary: 118 mg/dL — ABNORMAL HIGH (ref 70–99)

## 2017-09-13 MED ORDER — POTASSIUM CHLORIDE 10 MEQ/50ML IV SOLN
10.0000 meq | INTRAVENOUS | Status: AC
  Administered 2017-09-13 (×3): 10 meq via INTRAVENOUS
  Filled 2017-09-13 (×3): qty 50

## 2017-09-13 MED ORDER — SODIUM CHLORIDE 0.9% FLUSH: 10.0000 mL | INTRAVENOUS | Status: DC | PRN

## 2017-09-13 MED ORDER — CHLORHEXIDINE GLUCONATE CLOTH 2 % EX PADS
6.0000 | MEDICATED_PAD | Freq: Every day | CUTANEOUS | Status: DC
  Administered 2017-09-13 – 2017-09-15 (×2): 6 via TOPICAL

## 2017-09-13 NOTE — Progress Notes (Signed)
      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 Roxan Hockey, MD [facility] [phone]

## 2017-09-13 NOTE — Progress Notes (Signed)
Patient transferred to 4E room 06 at this time. Telemetry applied and CCMD notified. 2nd verifier present. Patient oreinted to room and how to call nurse with any needs.  Emelda Fear, RN

## 2017-09-13 NOTE — Progress Notes (Signed)
2 Days Post-Op Procedure(s) (LRB): STERNOTOMY (N/A) RESECTION OF MEDIASTINAL MASS (N/A) Subjective: Patient doing well sitting up in chair Chest x-ray clear Sinus rhythm Pathology on mediastinal mass-ectopic thyroid substernal position  Objective: Vital signs in last 24 hours: Temp:  [97.9 F (36.6 C)-99.1 F (37.3 C)] 98 F (36.7 C) (09/05 0700) Pulse Rate:  [69-95] 86 (09/05 0700) Cardiac Rhythm: Normal sinus rhythm;Heart block (09/05 0400) Resp:  [10-31] 14 (09/05 0700) BP: (91-135)/(40-103) 108/43 (09/05 0700) SpO2:  [90 %-100 %] 90 % (09/05 0700) Arterial Line BP: (137)/(52) 137/52 (09/04 0900)  Hemodynamic parameters for last 24 hours:  Stable  Intake/Output from previous day: 09/04 0701 - 09/05 0700 In: 1873.6 [P.O.:1720; I.V.:153.6] Out: 810 [Urine:750; Chest Tube:60] Intake/Output this shift: No intake/output data recorded.       Exam    General- alert and comfortable    Neck- no JVD, no cervical adenopathy palpable, no carotid bruit   Lungs- clear without rales, wheezes   Cor- regular rate and rhythm, no murmur , gallop   Abdomen- soft, non-tender   Extremities - warm, non-tender, minimal edema   Neuro- oriented, appropriate, no focal weakness   Lab Results: Recent Labs    09/12/17 0438 09/13/17 0400  WBC 11.5* 11.6*  HGB 10.6* 10.0*  HCT 32.6* 31.1*  PLT 227 204   BMET:  Recent Labs    09/12/17 0438 09/13/17 0400  NA 137 141  K 3.9 3.7  CL 106 106  CO2 23 29  GLUCOSE 238* 123*  BUN 13 13  CREATININE 1.03* 0.98  CALCIUM 7.4* 7.4*    PT/INR: No results for input(s): LABPROT, INR in the last 72 hours. ABG    Component Value Date/Time   PHART 7.322 (L) 09/12/2017 0401   HCO3 21.8 09/12/2017 0401   TCO2 23 09/12/2017 0401   ACIDBASEDEF 4.0 (H) 09/12/2017 0401   O2SAT 96.0 09/12/2017 0401   CBG (last 3)  Recent Labs    09/12/17 1619 09/12/17 2150 09/13/17 0653  GLUCAP 92 104* 114*    Assessment/Plan: S/P Procedure(s)  (LRB): STERNOTOMY (N/A) RESECTION OF MEDIASTINAL MASS (N/A) Mobilize Diuresis d/c tubes/lines Plan for transfer to step-down: see transfer orders   LOS: 2 days    Tharon Aquas Trigt III 09/13/2017

## 2017-09-13 NOTE — Plan of Care (Signed)

## 2017-09-13 NOTE — Plan of Care (Signed)
Pt. Able to ambulate to bathroom without incidence, activity intolerance is not noted, patient states decreased anxiety, and pain management is being achieved.

## 2017-09-14 ENCOUNTER — Inpatient Hospital Stay (HOSPITAL_COMMUNITY): Payer: Medicare HMO

## 2017-09-14 LAB — BASIC METABOLIC PANEL
Anion gap: 7 (ref 5–15)
BUN: 10 mg/dL (ref 8–23)
CO2: 32 mmol/L (ref 22–32)
Calcium: 7.8 mg/dL — ABNORMAL LOW (ref 8.9–10.3)
Chloride: 100 mmol/L (ref 98–111)
Creatinine, Ser: 0.95 mg/dL (ref 0.44–1.00)
GFR calc Af Amer: >60 mL/min (ref >60)
GFR calc non Af Amer: >60 mL/min (ref >60)
Glucose, Bld: 102 mg/dL — ABNORMAL HIGH (ref 70–99)
Potassium: 3.3 mmol/L — ABNORMAL LOW (ref 3.5–5.1)
Sodium: 139 mmol/L (ref 135–145)

## 2017-09-14 LAB — CBC
HCT: 30.8 % — ABNORMAL LOW (ref 36.0–46.0)
Hemoglobin: 9.7 g/dL — ABNORMAL LOW (ref 12.0–15.0)
MCH: 29.6 pg (ref 26.0–34.0)
MCHC: 31.5 g/dL (ref 30.0–36.0)
MCV: 93.9 fL (ref 78.0–100.0)
Platelets: 206 10*3/uL (ref 150–400)
RBC: 3.28 MIL/uL — ABNORMAL LOW (ref 3.87–5.11)
RDW: 13.4 % (ref 11.5–15.5)
WBC: 8.2 10*3/uL (ref 4.0–10.5)

## 2017-09-14 LAB — GLUCOSE, CAPILLARY
Glucose-Capillary: 99 mg/dL (ref 70–99)
Glucose-Capillary: 115 mg/dL — ABNORMAL HIGH (ref 70–99)
Glucose-Capillary: 135 mg/dL — ABNORMAL HIGH (ref 70–99)
Glucose-Capillary: 126 mg/dL — ABNORMAL HIGH (ref 70–99)

## 2017-09-14 MED ORDER — POTASSIUM CHLORIDE CRYS ER 20 MEQ PO TBCR
40.0000 meq | EXTENDED_RELEASE_TABLET | Freq: Once | ORAL | Status: AC
  Administered 2017-09-14: 40 meq via ORAL
  Filled 2017-09-14: qty 2

## 2017-09-14 NOTE — Progress Notes (Addendum)
[address]       [place] [address]             [phone]      3 Days Post-Op Procedure(s) (LRB): STERNOTOMY (N/A) RESECTION OF MEDIASTINAL MASS (N/A) Subjective: Pain pretty well controlled, no mor vomiting since oxy d/c'd  Objective: Vital signs in last 24 hours: Temp:  [97.9 F (36.6 C)-99.3 F (37.4 C)] 98.3 F (36.8 C) (09/06 0518) Pulse Rate:  [68-89] 89 (09/06 0518) Cardiac Rhythm: Normal sinus rhythm (09/05 1923) Resp:  [9-23] 15 (09/05 2309) BP: (106-132)/(43-94) 115/48 (09/06 0518) SpO2:  [96 %-100 %] 97 % (09/06 0518) Weight:  [63.7 kg] 63.7 kg (09/05 2210)  Hemodynamic parameters for last 24 hours:    Intake/Output from previous day: No intake/output data recorded. Intake/Output this shift: No intake/output data recorded.  General appearance: alert, cooperative and no distress Heart: regular rate and rhythm Lungs: mildly dim in lower fields Abdomen: benign Extremities: no edema or calf tenderness Wound: incis- dressing is CDI  Lab Results: Recent Labs    09/13/17 0400 09/14/17 0543  WBC 11.6* 8.2  HGB 10.0* 9.7*  HCT 31.1* 30.8*  PLT 204 206   BMET:  Recent Labs    09/13/17 0400 09/14/17 0543  NA 141 139  K 3.7 3.3*  CL 106 100  CO2 29 32  GLUCOSE 123* 102*  BUN 13 10  CREATININE 0.98 0.95  CALCIUM 7.4* 7.8*    PT/INR: No results for input(s): LABPROT, INR in the last 72 hours. ABG    Component Value Date/Time   PHART 7.322 (L) 09/12/2017 0401   HCO3 21.8 09/12/2017 0401   TCO2 23 09/12/2017 0401   ACIDBASEDEF 4.0 (H) 09/12/2017 0401   O2SAT 96.0 09/12/2017 0401   CBG (last 3)  Recent Labs    09/13/17 1633 09/13/17 2106 09/14/17 0620  GLUCAP 130* 118* 99    Meds Scheduled Meds: . acetaminophen  1,000 mg Oral Q6H   Or  . acetaminophen (TYLENOL) oral liquid 160 mg/5 mL  1,000 mg Oral Q6H  . bisacodyl  10 mg Oral Daily  . Chlorhexidine Gluconate Cloth  6 each Topical Daily  . conjugated  estrogens  1 Applicatorful Vaginal Once per day on Mon Thu  . cycloSPORINE  1 drop Both Eyes Daily  . enoxaparin (LOVENOX) injection  30 mg Subcutaneous Q24H  . lisinopril  20 mg Oral Daily   And  . hydrochlorothiazide  12.5 mg Oral Daily  . insulin aspart  0-15 Units Subcutaneous TID WC  . insulin aspart  0-5 Units Subcutaneous QHS  . [START ON 09/15/2017] levothyroxine  50 mcg Oral Once per day on Sun Sat  . levothyroxine  75 mcg Oral Once per day on Mon Tue Wed Thu Fri  . metoprolol tartrate  25 mg Oral Daily  . senna-docusate  1 tablet Oral QHS   Continuous Infusions: . potassium chloride     PRN Meds:.fentaNYL (SUBLIMAZE) injection, hydrALAZINE, ondansetron (ZOFRAN) IV, oxyCODONE, potassium chloride, sodium chloride flush, traMADol, valACYclovir  Xrays Dg Chest Port 1 View  Result Date: 09/13/2017 CLINICAL DATA:  Cardiac surg,chest tube removed,,sore chest EXAM: PORTABLE CHEST 1 VIEW COMPARISON:  Chest x-ray dated 09/12/2017. FINDINGS: Heart size and mediastinal contours are stable. RIGHT IJ central catheter is stable in position with tip at the level of the lower SVC. Median sternotomy wires appear intact and stable in alignment. Lungs are clear. No evidence of pneumonia or pulmonary edema. No  pleural effusion or pneumothorax seen. No acute or suspicious osseous finding. IMPRESSION: No acute findings. Electronically Signed   By: Franki Cabot M.D.   On: 09/13/2017 10:44    Assessment/Plan: S/P Procedure(s) (LRB): STERNOTOMY (N/A) RESECTION OF MEDIASTINAL MASS (N/A)   1 conts to make good progress 2 hemodyn stable in sinus rhythm 3 sats good on RA- cont routine pulm toilet/IS 4 replace K+ for 3.3 5 BS control is ok, not a diabetic 6 expected ABL anemia is pretty stable 7 renal function is normal 8 routine activity progression 9 d/c IJ central line 10 poss home in am    LOS: 3 days    [name] 09/14/2017 Pager [phone] Plan home in am Cont preop meds No oxycodone-  use ultram patient examined and medical record reviewed,agree with above note. Tharon Aquas Trigt III 09/14/2017

## 2017-09-14 NOTE — Care Management Important Message (Signed)
Important Message  Patient Details  Name: [name] MRN: [id number] Date of Birth: 1950-06-25   Medicare Important Message Given:  Yes    Barb Merino Gen Clagg 09/14/2017, 3:33 PM

## 2017-09-14 NOTE — Progress Notes (Signed)
Central Line removed by unit nurse. [name]

## 2017-09-14 NOTE — Progress Notes (Signed)
Central line removed, per order. Catheter intact. Pressure applied. No bleeding noted. Dressing applied. Pt tolerated well.   Ara Kussmaul BSN, RN
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
## 2017-09-14 NOTE — Discharge Instructions (Signed)
Discharge Instructions:  1. You may shower, please wash incisions daily with soap and water and keep dry.  If you wish to cover wounds with dressing you may do so but please keep clean and change daily.  No tub baths or swimming until incisions have completely healed.  If your incisions become red or develop any drainage please call our office at [phone]  2. No Driving until cleared by surgeon office and you are no longer using narcotic pain medications  3. Monitor your weight daily.. Please use the same scale and weigh at same time... If you gain 5-10 lbs in 48 hours with associated lower extremity swelling, please contact our office at [phone]  4. Fever of 101.5 for at least 24 hours with no source, please contact our office at [phone]  5. Activity- up as tolerated, please walk at least 3 times per day.  Avoid strenuous activity, no lifting, pushing, or pulling with your arms over 8-10 lbs for a minimum of 6 weeks  6. If any questions or concerns arise, please do not hesitate to contact our office at [phone]  7.  Continue you to use your incentive spirometry for your breathing exercises as you have been in the hospital.

## 2017-09-15 LAB — GLUCOSE, CAPILLARY
Glucose-Capillary: 108 mg/dL — ABNORMAL HIGH (ref 70–99)
Glucose-Capillary: 144 mg/dL — ABNORMAL HIGH (ref 70–99)

## 2017-09-15 MED ORDER — TRAMADOL HCL 50 MG PO TABS: 50.0000 mg | ORAL_TABLET | Freq: Four times a day (QID) | ORAL | 0 refills | Status: DC | PRN

## 2017-09-15 MED ORDER — LACTULOSE 10 GM/15ML PO SOLN
20.0000 g | Freq: Once | ORAL | Status: AC
  Administered 2017-09-15: 20 g via ORAL
  Filled 2017-09-15: qty 30

## 2017-09-15 NOTE — Plan of Care (Signed)
Patient is adequate for discharge.  

## 2017-09-15 NOTE — Progress Notes (Addendum)
      [address]       [place] [address]             [phone]       4 Days Post-Op Procedure(s) (LRB): STERNOTOMY (N/A) RESECTION OF MEDIASTINAL MASS (N/A)  Subjective: Patient with headache this am. She has not had a bowel movement yet.  Objective: Vital signs in last 24 hours: Temp:  [97.7 F (36.5 C)-98.3 F (36.8 C)] 98 F (36.7 C) (09/07 0341) Pulse Rate:  [77-89] 88 (09/07 0341) Cardiac Rhythm: Normal sinus rhythm (09/06 1912) Resp:  [12-14] 12 (09/07 0341) BP: (119-132)/(53-68) 126/68 (09/07 0341) SpO2:  [94 %-100 %] 100 % (09/07 0341)     Intake/Output from previous day: 09/06 0701 - 09/07 0700 In: 1640 [P.O.:1640] Out: -    Physical Exam:  Cardiovascular: RRR Pulmonary: Clear to auscultation bilaterally Abdomen: Soft, non tender, bowel sounds present. Extremities: SCDs in place Wounds: Clean and dry.  No erythema or signs of infection.   Lab Results: CBC: Recent Labs    09/13/17 0400 09/14/17 0543  WBC 11.6* 8.2  HGB 10.0* 9.7*  HCT 31.1* 30.8*  PLT 204 206   BMET:  Recent Labs    09/13/17 0400 09/14/17 0543  NA 141 139  K 3.7 3.3*  CL 106 100  CO2 29 32  GLUCOSE 123* 102*  BUN 13 10  CREATININE 0.98 0.95  CALCIUM 7.4* 7.8*    PT/INR: No results for input(s): LABPROT, INR in the last 72 hours. ABG:  INR: Will add last result for INR, ABG once components are confirmed Will add last 4 CBG results once components are confirmed  Assessment/Plan:  1. CV - First degree heart block. On Lopressor 25 mg daily, Lisinopril 20 mg daily, and HCTZ 12.5 mg daily. 2.  Pulmonary - On room air. Encourage incentive spirometer. Await final pathology of anterior mediastinal mass (likely substernal thyroid goiter ). 3. Lactulose for constipation 4. Discharge later today  [name] [name] 09/15/2017,7:15 AM [phone]   Chart reviewed, patient examined, agree with above. She had 2 BM's and is doing well otherwise  so I think she can go home.

## 2017-09-15 NOTE — Plan of Care (Signed)
Care plans reviewed and patient is progressing.  

## 2017-09-15 NOTE — Progress Notes (Signed)
Discharge instructions given to Mrs and Mr Mickel.  Discussed new medications and side effects.  Discussed activities and restrictions.  Discussed follow up appointments and signs and symptoms to watch for and when to contact the physician.  Verbalized understanding. +

## 2017-09-17 ENCOUNTER — Ambulatory Visit: Payer: Commercial Managed Care - HMO

## 2017-09-21 ENCOUNTER — Ambulatory Visit (INDEPENDENT_AMBULATORY_CARE_PROVIDER_SITE_OTHER): Payer: Self-pay

## 2017-09-21 DIAGNOSIS — Z4802 Encounter for removal of sutures: Secondary | ICD-10-CM

## 2017-09-21 NOTE — Progress Notes (Signed)
Removed 2 sutures from chest tube incision. No signs of infection and patient tolerated well.

## 2017-10-02 ENCOUNTER — Other Ambulatory Visit: Payer: Self-pay | Admitting: Cardiothoracic Surgery

## 2017-10-02 DIAGNOSIS — J9859 Other diseases of mediastinum, not elsewhere classified: Secondary | ICD-10-CM

## 2017-10-03 ENCOUNTER — Encounter: Payer: Self-pay | Admitting: Cardiothoracic Surgery

## 2017-10-03 ENCOUNTER — Ambulatory Visit
Admission: RE | Admit: 2017-10-03 | Discharge: 2017-10-03 | Disposition: A | Discharge status: Home / Self Care | Payer: Medicare HMO | Source: Ambulatory Visit | Attending: Cardiothoracic Surgery | Admitting: Cardiothoracic Surgery

## 2017-10-03 ENCOUNTER — Ambulatory Visit (INDEPENDENT_AMBULATORY_CARE_PROVIDER_SITE_OTHER): Payer: Self-pay | Admitting: Cardiothoracic Surgery

## 2017-10-03 ENCOUNTER — Other Ambulatory Visit: Payer: Self-pay

## 2017-10-03 VITALS — BP 122/76 | HR 76 | Resp 16 | Ht 63.0 in | Wt 139.2 lb

## 2017-10-03 DIAGNOSIS — R079 Chest pain, unspecified: Secondary | ICD-10-CM | POA: Diagnosis not present

## 2017-10-03 DIAGNOSIS — J9859 Other diseases of mediastinum, not elsewhere classified: Secondary | ICD-10-CM

## 2017-10-03 DIAGNOSIS — Z09 Encounter for follow-up examination after completed treatment for conditions other than malignant neoplasm: Secondary | ICD-10-CM

## 2017-10-03 NOTE — Progress Notes (Signed)
PCP is Josetta Huddle, MD Referring Provider is Josetta Huddle, MD  Chief Complaint  Patient presents with  . Routine Post Op    s/p Resection of Mediadtinal Mass 09/11/17 with a CXR    HPI: First postop visit for sternotomy and resection of a large 5 cm mediastinal mass which turned out to be a sub-sternal thyroid goiter.  Patient had a previous thyroidectomy for multinodular goiter 10 years ago.  She is healed well but still has some surgical pain.  She is taking tramadol as needed as well as Tylenol.  I provided the patient with the pathology report and explained that this tumor is benign and should not recur.  We discussed her safe activity levels at this point including driving and not lifting more than 10 pounds.  Chest x-ray performed today shows clear lung fields no pleural effusion sternal wires intact. Past Medical History:  Diagnosis Date  . GERD (gastroesophageal reflux disease)   . Headache    ocassionally  . Hypertension   . PONV (postoperative nausea and vomiting)     Past Surgical History:  Procedure Laterality Date  . COLONOSCOPY    . KNEE SURGERY Left    torn cartilage  . RESECTION OF MEDIASTINAL MASS N/A 09/11/2017   Procedure: RESECTION OF MEDIASTINAL MASS;  Surgeon: Ivin Poot, MD;  Location: [facility];  Service: Thoracic;  Laterality: N/A;  . STERNOTOMY N/A 09/11/2017   Procedure: STERNOTOMY;  Surgeon: Prescott Gum, Collier Salina, MD;  Location: [facility];  Service: Thoracic;  Laterality: N/A;  . THYROIDECTOMY      History reviewed. No pertinent family history.  Social History Social History   Tobacco Use  . Smoking status: Never Smoker  . Smokeless tobacco: Never Used  Substance Use Topics  . Alcohol use: Never    Frequency: Never  . Drug use: Never    Current Outpatient Medications  Medication Sig Dispense Refill  . Artificial Tear Solution (SOOTHE XP OP) Place 1 drop into both eyes daily.    Marland Kitchen conjugated estrogens (PREMARIN) vaginal cream Place 1  Applicatorful vaginally 2 (two) times a week.    . cycloSPORINE (RESTASIS) 0.05 % ophthalmic emulsion Place 1 drop into both eyes daily.    Marland Kitchen levothyroxine (SYNTHROID, LEVOTHROID) 50 MCG tablet Take 50 mcg by mouth See admin instructions. Alternates between 50 mcg and 75 mcg .Marland Kitchen 50 mcg taken Sat and Sun    . levothyroxine (SYNTHROID, LEVOTHROID) 75 MCG tablet Take 75 mcg by mouth See admin instructions. Takes 5 days a week Mon - Fri    . lisinopril-hydrochlorothiazide (PRINZIDE,ZESTORETIC) 20-12.5 MG tablet Take 1 tablet by mouth daily.     . metoprolol tartrate (LOPRESSOR) 25 MG tablet Take 25 mg by mouth daily.     . traMADol (ULTRAM) 50 MG tablet Take 1 tablet (50 mg total) by mouth every 6 (six) hours as needed for severe pain (mild pain). 28 tablet 0  . valACYclovir (VALTREX) 500 MG tablet Take 500 mg by mouth daily as needed (breakouts).   0  . White Petrolatum-Mineral Oil (SYSTANE NIGHTTIME OP) Place 1 drop into both eyes at bedtime.     Current Facility-Administered Medications  Medication Dose Route Frequency Provider Last Rate Last Dose  . betamethasone acetate-betamethasone sodium phosphate (CELESTONE) injection 3 mg  3 mg Intramuscular Once Edrick Kins, DPM        Allergies  Allergen Reactions  . Penicillins Nausea And Vomiting    Has patient had a PCN reaction causing immediate  rash, facial/tongue/throat swelling, SOB or lightheadedness with hypotension: no Has patient had a PCN reaction causing severe rash involving mucus membranes or skin necrosis: no Has patient had a PCN reaction that required hospitalization: no Has patient had a PCN reaction occurring within the last 10 years: no If all of the above answers are "NO", then may proceed with Cephalosporin use.     Review of Systems  Appetite and strength gradually improving  BP 122/76 (BP Location: Right Arm, Patient Position: Sitting, Cuff Size: Normal)   Pulse 76   Resp 16   Ht 5\' 3"  (1.6 m)   Wt 139 lb 3.2 oz  (63.1 kg)   SpO2 98% Comment: ON RA  BMI 24.66 kg/m  Physical Exam      Exam    General- alert and comfortable    Neck- no JVD, no cervical adenopathy palpable, no carotid bruit   Lungs- clear without rales,.  Sternal incision well-healed.   Cor- regular rate and rhythm, no murmur , gallop   Abdomen- soft, non-tender   Extremities - warm, non-tender, minimal edema   Neuro- oriented, appropriate, no focal weakness   Diagnostic Tests: Chest x-ray clear images personally reviewed.  Impression: Patient understands sternal incision not completely healed until 3 months after surgery.  He understands she may drive and lift light objects no more than 10 pounds.  She has a trip planned to [place] in late October and I will see her in the office in mid October prior to her departure.  Plan: Continue current meds.  Increase activity levels as discussed.  Return for review of progress on October 16.   Len Childs, MD [facility] [phone]

## 2017-10-15 ENCOUNTER — Ambulatory Visit: Payer: Medicare HMO

## 2017-10-24 ENCOUNTER — Other Ambulatory Visit: Payer: Self-pay

## 2017-10-24 ENCOUNTER — Encounter: Payer: Self-pay | Admitting: Cardiothoracic Surgery

## 2017-10-24 ENCOUNTER — Ambulatory Visit (INDEPENDENT_AMBULATORY_CARE_PROVIDER_SITE_OTHER): Payer: Self-pay | Admitting: Cardiothoracic Surgery

## 2017-10-24 VITALS — BP 120/74 | HR 68 | Resp 16 | Ht 63.0 in | Wt 141.4 lb

## 2017-10-24 DIAGNOSIS — Z09 Encounter for follow-up examination after completed treatment for conditions other than malignant neoplasm: Secondary | ICD-10-CM

## 2017-10-24 DIAGNOSIS — E079 Disorder of thyroid, unspecified: Secondary | ICD-10-CM

## 2017-10-24 NOTE — Progress Notes (Signed)
PCP is Josetta Huddle, MD Referring Provider is Josetta Huddle, MD  Chief Complaint  Patient presents with  . Routine Post Op    s/p REMOVAL OF BENIGN THYROID MASS 09/11/17 to assess progress    HPI: Patient returns for 1 month postop visit after sternotomy for resection of a 5 cm anterior mediastinal mass which returned pathology as benign goiter  She is doing well without significant pain. Incision is healing well without clicking or popping.  She reports she is walking 20 to 30 minutes a day.   Past Medical History:  Diagnosis Date  . GERD (gastroesophageal reflux disease)   . Headache    ocassionally  . Hypertension   . PONV (postoperative nausea and vomiting)     Past Surgical History:  Procedure Laterality Date  . COLONOSCOPY    . KNEE SURGERY Left    torn cartilage  . RESECTION OF MEDIASTINAL MASS N/A 09/11/2017   Procedure: RESECTION OF MEDIASTINAL MASS;  Surgeon: Ivin Poot, MD;  Location: [facility];  Service: Thoracic;  Laterality: N/A;  . STERNOTOMY N/A 09/11/2017   Procedure: STERNOTOMY;  Surgeon: Prescott Gum, Collier Salina, MD;  Location: [facility];  Service: Thoracic;  Laterality: N/A;  . THYROIDECTOMY      History reviewed. No pertinent family history.  Social History Social History   Tobacco Use  . Smoking status: Never Smoker  . Smokeless tobacco: Never Used  Substance Use Topics  . Alcohol use: Never    Frequency: Never  . Drug use: Never    Current Outpatient Medications  Medication Sig Dispense Refill  . Artificial Tear Solution (SOOTHE XP OP) Place 1 drop into both eyes daily.    Marland Kitchen conjugated estrogens (PREMARIN) vaginal cream Place 1 Applicatorful vaginally 2 (two) times a week.    . cycloSPORINE (RESTASIS) 0.05 % ophthalmic emulsion Place 1 drop into both eyes daily.    Marland Kitchen levothyroxine (SYNTHROID, LEVOTHROID) 50 MCG tablet Take 50 mcg by mouth See admin instructions. Alternates between 50 mcg and 75 mcg .Marland Kitchen 50 mcg taken Sat and Sun    . levothyroxine  (SYNTHROID, LEVOTHROID) 75 MCG tablet Take 75 mcg by mouth See admin instructions. Takes 5 days a week Mon - Fri    . lisinopril-hydrochlorothiazide (PRINZIDE,ZESTORETIC) 20-12.5 MG tablet Take 1 tablet by mouth daily.     . metoprolol tartrate (LOPRESSOR) 25 MG tablet Take 25 mg by mouth daily.     . valACYclovir (VALTREX) 500 MG tablet Take 500 mg by mouth daily as needed (breakouts).   0  . White Petrolatum-Mineral Oil (SYSTANE NIGHTTIME OP) Place 1 drop into both eyes at bedtime.     Current Facility-Administered Medications  Medication Dose Route Frequency Provider Last Rate Last Dose  . betamethasone acetate-betamethasone sodium phosphate (CELESTONE) injection 3 mg  3 mg Intramuscular Once Edrick Kins, DPM        Allergies  Allergen Reactions  . Penicillins Nausea And Vomiting    Has patient had a PCN reaction causing immediate rash, facial/tongue/throat swelling, SOB or lightheadedness with hypotension: no Has patient had a PCN reaction causing severe rash involving mucus membranes or skin necrosis: no Has patient had a PCN reaction that required hospitalization: no Has patient had a PCN reaction occurring within the last 10 years: no If all of the above answers are "NO", then may proceed with Cephalosporin use.     Review of Systems   Improved exercise tolerance and strength Minimal dry cough none at night  BP 120/74 (  BP Location: Left Arm, Patient Position: Sitting, Cuff Size: Normal)   Pulse 68   Resp 16   Ht 5\' 3"  (1.6 m)   Wt 141 lb 6.4 oz (64.1 kg)   SpO2 99% Comment: ON RA  BMI 25.05 kg/m  Physical Exam      Exam    General- alert and comfortable    Neck- no JVD, no cervical adenopathy palpable, no carotid bruit   Lungs- clear without rales, wheezes   Cor- regular rate and rhythm, no murmur , gallop   Abdomen- soft, non-tender   Extremities - warm, non-tender, minimal edema   Neuro- oriented, appropriate, no focal weakness   Diagnostic Tests: Chest  x-ray clear  Impression: Doing well in early recovery after sternotomy and resection of anterior mediastinal mass  Plan: Patient may drive, lift up to 89-21 pounds but no heavier at this time.  She will return for review of progress in 1 month.   Len Childs, MD [facility] [phone]

## 2017-11-08 DIAGNOSIS — Z23 Encounter for immunization: Secondary | ICD-10-CM | POA: Diagnosis not present

## 2017-11-28 ENCOUNTER — Other Ambulatory Visit: Payer: Self-pay

## 2017-11-28 ENCOUNTER — Ambulatory Visit (INDEPENDENT_AMBULATORY_CARE_PROVIDER_SITE_OTHER): Payer: Self-pay | Admitting: Cardiothoracic Surgery

## 2017-11-28 ENCOUNTER — Encounter: Payer: Self-pay | Admitting: Cardiothoracic Surgery

## 2017-11-28 VITALS — BP 144/73 | HR 71 | Resp 16 | Ht 63.0 in | Wt 146.4 lb

## 2017-11-28 DIAGNOSIS — Z09 Encounter for follow-up examination after completed treatment for conditions other than malignant neoplasm: Secondary | ICD-10-CM

## 2017-11-28 DIAGNOSIS — J9859 Other diseases of mediastinum, not elsewhere classified: Secondary | ICD-10-CM

## 2017-11-28 NOTE — Progress Notes (Signed)
PCP is Josetta Huddle, MD Referring Provider is Josetta Huddle, MD  Chief Complaint  Patient presents with  . Routine Post Op    1 month f/u s/p RESECTION of MEDIASTINAL MASS 09/11/17    HPI: Routine postop follow-up 2 and half months after sternotomy for resection of a 8 cm anterior mediastinal mass which on pathology demonstrated substernal goiter, benign.  The patient is on Synthroid as prescribed by her endocrinologist.  The incision is still somewhat tender but has healed very well.  Chest x-ray today is clear.  She can lift the sternal precautions after December 1.  I will see her back after the first year for final postop check.  I told the patient she could resume her yoga and gym activities in mid December.   Past Medical History:  Diagnosis Date  . GERD (gastroesophageal reflux disease)   . Headache    ocassionally  . Hypertension   . PONV (postoperative nausea and vomiting)     Past Surgical History:  Procedure Laterality Date  . COLONOSCOPY    . KNEE SURGERY Left    torn cartilage  . RESECTION OF MEDIASTINAL MASS N/A 09/11/2017   Procedure: RESECTION OF MEDIASTINAL MASS;  Surgeon: Ivin Poot, MD;  Location: [place];  Service: Thoracic;  Laterality: N/A;  . STERNOTOMY N/A 09/11/2017   Procedure: STERNOTOMY;  Surgeon: Prescott Gum, Collier Salina, MD;  Location: [place];  Service: Thoracic;  Laterality: N/A;  . THYROIDECTOMY      History reviewed. No pertinent family history.  Social History Social History   Tobacco Use  . Smoking status: Never Smoker  . Smokeless tobacco: Never Used  Substance Use Topics  . Alcohol use: Never    Frequency: Never  . Drug use: Never    Current Outpatient Medications  Medication Sig Dispense Refill  . Artificial Tear Solution (SOOTHE XP OP) Place 1 drop into both eyes daily.    Marland Kitchen conjugated estrogens (PREMARIN) vaginal cream Place 1 Applicatorful vaginally 2 (two) times a week.    . cycloSPORINE (RESTASIS) 0.05 % ophthalmic emulsion Place 1  drop into both eyes daily.    Marland Kitchen levothyroxine (SYNTHROID, LEVOTHROID) 50 MCG tablet Take 50 mcg by mouth See admin instructions. Alternates between 50 mcg and 75 mcg .Marland Kitchen 50 mcg taken Sat and Sun    . levothyroxine (SYNTHROID, LEVOTHROID) 75 MCG tablet Take 75 mcg by mouth See admin instructions. Takes 5 days a week Mon - Fri    . lisinopril-hydrochlorothiazide (PRINZIDE,ZESTORETIC) 20-12.5 MG tablet Take 1 tablet by mouth daily.     . metoprolol tartrate (LOPRESSOR) 25 MG tablet Take 25 mg by mouth daily.     . valACYclovir (VALTREX) 500 MG tablet Take 500 mg by mouth daily as needed (breakouts).   0  . White Petrolatum-Mineral Oil (SYSTANE NIGHTTIME OP) Place 1 drop into both eyes at bedtime.     Current Facility-Administered Medications  Medication Dose Route Frequency Provider Last Rate Last Dose  . betamethasone acetate-betamethasone sodium phosphate (CELESTONE) injection 3 mg  3 mg Intramuscular Once Edrick Kins, DPM        Allergies  Allergen Reactions  . Penicillins Nausea And Vomiting    Has patient had a PCN reaction causing immediate rash, facial/tongue/throat swelling, SOB or lightheadedness with hypotension: no Has patient had a PCN reaction causing severe rash involving mucus membranes or skin necrosis: no Has patient had a PCN reaction that required hospitalization: no Has patient had a PCN reaction occurring within the  last 10 years: no If all of the above answers are "NO", then may proceed with Cephalosporin use.     Review of Systems   Expected surgical incisional tenderness  BP (!) 144/73 (BP Location: Right Arm, Patient Position: Sitting, Cuff Size: Large)   Pulse 71   Resp 16   Ht 5\' 3"  (1.6 m)   Wt 146 lb 6.4 oz (66.4 kg)   SpO2 96% Comment: ON RA  BMI 25.93 kg/m  Physical Exam      Exam    General- alert and comfortable    Neck- no JVD, no cervical adenopathy palpable, no carotid bruit   Lungs- clear without rales, wheezes.  Sternal incision clean and  dry.   Cor- regular rate and rhythm, no murmur , gallop   Abdomen- soft, non-tender   Extremities - warm, non-tender, minimal edema   Neuro- oriented, appropriate, no focal weakness   Diagnostic Tests: Chest x-ray clear  Impression: Excellent recovery after sternotomy and resection of a large substernal goiter, 8x6 cm.  Plan: Return in 8 weeks for final wound check and discussion of activities. Continue Synthroid as directed by her endocrinologist.  Len Childs, MD [facility] [phone]

## 2017-12-24 ENCOUNTER — Ambulatory Visit: Payer: Medicare HMO

## 2018-01-28 ENCOUNTER — Encounter: Payer: Self-pay | Admitting: Cardiothoracic Surgery

## 2018-01-28 ENCOUNTER — Other Ambulatory Visit: Payer: Self-pay

## 2018-01-28 ENCOUNTER — Ambulatory Visit: Payer: Medicare Other | Admitting: Cardiothoracic Surgery

## 2018-01-28 VITALS — BP 138/77 | HR 68 | Resp 16 | Ht 63.0 in | Wt 147.4 lb

## 2018-01-28 DIAGNOSIS — Z09 Encounter for follow-up examination after completed treatment for conditions other than malignant neoplasm: Secondary | ICD-10-CM | POA: Diagnosis not present

## 2018-01-28 DIAGNOSIS — J9859 Other diseases of mediastinum, not elsewhere classified: Secondary | ICD-10-CM | POA: Diagnosis not present

## 2018-01-28 NOTE — Progress Notes (Signed)
PCP is Josetta Huddle, MD Referring Provider is Josetta Huddle, MD  Chief Complaint  Patient presents with  . Routine Post Op    final post op check s/p RESECTION of MEDIASTINAL MASS 09/21/17    HPI: The patient returns for surgical follow-up after undergoing resection of a large substernal goiter September 2019.  She is doing well but she still has some poststernotomy soreness.  The incision is well-healed and sternum has bonded.  She is active at her fitness center and has questions about what is safe for her to lift at this point.  She also enjoys yoga which of course requires supporting body weight.  We discussed the fact that the sternum has healed together but the chest wall soft tissue scar will still be somewhat tender and sore for another several months.  She should gradually progress her activities and lifting limits to what she was doing before surgery over the next 3 to 6 months.  I plan on having her return at 1 year postop with a CT scan of the chest to assess the area of  resection.   Past Medical History:  Diagnosis Date  . GERD (gastroesophageal reflux disease)   . Headache    ocassionally  . Hypertension   . PONV (postoperative nausea and vomiting)     Past Surgical History:  Procedure Laterality Date  . COLONOSCOPY    . KNEE SURGERY Left    torn cartilage  . RESECTION OF MEDIASTINAL MASS N/A 09/11/2017   Procedure: RESECTION OF MEDIASTINAL MASS;  Surgeon: Ivin Poot, MD;  Location: [place];  Service: Thoracic;  Laterality: N/A;  . STERNOTOMY N/A 09/11/2017   Procedure: STERNOTOMY;  Surgeon: Prescott Gum, Collier Salina, MD;  Location: [place];  Service: Thoracic;  Laterality: N/A;  . THYROIDECTOMY      History reviewed. No pertinent family history.  Social History Social History   Tobacco Use  . Smoking status: Never Smoker  . Smokeless tobacco: Never Used  Substance Use Topics  . Alcohol use: Never    Frequency: Never  . Drug use: Never    Current Outpatient  Medications  Medication Sig Dispense Refill  . Artificial Tear Solution (SOOTHE XP OP) Place 1 drop into both eyes daily.    Marland Kitchen conjugated estrogens (PREMARIN) vaginal cream Place 1 Applicatorful vaginally 2 (two) times a week.    . cycloSPORINE (RESTASIS) 0.05 % ophthalmic emulsion Place 1 drop into both eyes daily.    Marland Kitchen levothyroxine (SYNTHROID, LEVOTHROID) 50 MCG tablet Take 50 mcg by mouth See admin instructions. Alternates between 50 mcg and 75 mcg .Marland Kitchen 50 mcg taken Sat and Sun    . levothyroxine (SYNTHROID, LEVOTHROID) 75 MCG tablet Take 75 mcg by mouth See admin instructions. Takes 5 days a week Mon - Fri    . lisinopril-hydrochlorothiazide (PRINZIDE,ZESTORETIC) 20-12.5 MG tablet Take 1 tablet by mouth daily.     . metoprolol tartrate (LOPRESSOR) 25 MG tablet Take 25 mg by mouth daily.     . valACYclovir (VALTREX) 500 MG tablet Take 500 mg by mouth daily as needed (breakouts).   0  . White Petrolatum-Mineral Oil (SYSTANE NIGHTTIME OP) Place 1 drop into both eyes at bedtime.     Current Facility-Administered Medications  Medication Dose Route Frequency Provider Last Rate Last Dose  . betamethasone acetate-betamethasone sodium phosphate (CELESTONE) injection 3 mg  3 mg Intramuscular Once Edrick Kins, DPM        Allergies  Allergen Reactions  . Penicillins Nausea And  Vomiting    Has patient had a PCN reaction causing immediate rash, facial/tongue/throat swelling, SOB or lightheadedness with hypotension: no Has patient had a PCN reaction causing severe rash involving mucus membranes or skin necrosis: no Has patient had a PCN reaction that required hospitalization: no Has patient had a PCN reaction occurring within the last 10 years: no If all of the above answers are "NO", then may proceed with Cephalosporin use.     Review of Systems   Patient is 6 pounds heavier than she was before surgery No clicking or popping sensation in the sternal incision No shortness of breath  BP  138/77 (BP Location: Right Arm, Patient Position: Sitting, Cuff Size: Large)   Pulse 68   Resp 16   Ht 5\' 3"  (1.6 m)   Wt 147 lb 6.4 oz (66.9 kg)   SpO2 98% Comment: ON RA  BMI 26.11 kg/m  Physical Exam Sternal incision stable well-healed Breath sounds clear Heart rate regular Good range of motion of upper extremities No JVD or cervical mass  Diagnostic Tests: None  Impression: Doing well 4 months after partial sternotomy and resection of large substernal goiter.  Patient will advance her activity limits as discussed above  Plan: Return in September 2020 for CT scan of chest, surveillance status post resection of mediastinal mass   Len Childs, MD [facility] [phone]

## 2018-01-30 ENCOUNTER — Ambulatory Visit: Payer: Medicare HMO | Admitting: Cardiothoracic Surgery

## 2018-03-25 ENCOUNTER — Ambulatory Visit: Payer: Medicare HMO

## 2018-06-14 ENCOUNTER — Ambulatory Visit: Payer: Medicare Other

## 2018-08-23 ENCOUNTER — Other Ambulatory Visit: Payer: Self-pay | Admitting: *Deleted

## 2018-08-23 DIAGNOSIS — J9859 Other diseases of mediastinum, not elsewhere classified: Secondary | ICD-10-CM

## 2018-09-17 ENCOUNTER — Ambulatory Visit
Admission: RE | Admit: 2018-09-17 | Discharge: 2018-09-17 | Disposition: A | Discharge status: Home / Self Care | Payer: Medicare Other | Source: Ambulatory Visit | Attending: Internal Medicine | Admitting: Internal Medicine

## 2018-09-17 ENCOUNTER — Other Ambulatory Visit: Payer: Self-pay

## 2018-09-17 DIAGNOSIS — Z1231 Encounter for screening mammogram for malignant neoplasm of breast: Secondary | ICD-10-CM

## 2018-09-25 ENCOUNTER — Other Ambulatory Visit: Payer: Medicare Other

## 2018-09-25 ENCOUNTER — Ambulatory Visit: Payer: Medicare Other | Admitting: Cardiothoracic Surgery

## 2018-10-02 ENCOUNTER — Ambulatory Visit
Admission: RE | Admit: 2018-10-02 | Discharge: 2018-10-02 | Disposition: A | Discharge status: Home / Self Care | Payer: Medicare Other | Source: Ambulatory Visit | Attending: Cardiothoracic Surgery | Admitting: Cardiothoracic Surgery

## 2018-10-02 ENCOUNTER — Ambulatory Visit: Payer: Medicare Other | Admitting: Cardiothoracic Surgery

## 2018-10-02 ENCOUNTER — Other Ambulatory Visit: Payer: Self-pay

## 2018-10-02 ENCOUNTER — Encounter: Payer: Self-pay | Admitting: Cardiothoracic Surgery

## 2018-10-02 DIAGNOSIS — J9859 Other diseases of mediastinum, not elsewhere classified: Secondary | ICD-10-CM | POA: Diagnosis not present

## 2018-10-02 MED ORDER — IOPAMIDOL (ISOVUE-300) INJECTION 61%
75.0000 mL | Freq: Once | INTRAVENOUS | Status: AC | PRN
  Administered 2018-10-02: 75 mL via INTRAVENOUS

## 2018-10-02 NOTE — Progress Notes (Signed)
PCP is Josetta Huddle, MD Referring Provider is Josetta Huddle, MD  Chief Complaint  Patient presents with  . Mediastinal Mass    follow up with Chest CT, s/p sternotomy and resection    HPI: Scheduled final postop visit almost 1 year after sternotomy for resection of a large substernal goiter.  Patient has no residual symptoms.  The incision is well-healed.  CT scan of the chest shows no recurrence of the mass.  Lung fields are clear with some very small chronically stable nodules.   Past Medical History:  Diagnosis Date  . GERD (gastroesophageal reflux disease)   . Headache    ocassionally  . Hypertension   . PONV (postoperative nausea and vomiting)     Past Surgical History:  Procedure Laterality Date  . COLONOSCOPY    . KNEE SURGERY Left    torn cartilage  . RESECTION OF MEDIASTINAL MASS N/A 09/11/2017   Procedure: RESECTION OF MEDIASTINAL MASS;  Surgeon: Ivin Poot, MD;  Location: [place];  Service: Thoracic;  Laterality: N/A;  . STERNOTOMY N/A 09/11/2017   Procedure: STERNOTOMY;  Surgeon: Prescott Gum, Collier Salina, MD;  Location: [place];  Service: Thoracic;  Laterality: N/A;  . THYROIDECTOMY      History reviewed. No pertinent family history.  Social History Social History   Tobacco Use  . Smoking status: Never Smoker  . Smokeless tobacco: Never Used  Substance Use Topics  . Alcohol use: Never    Frequency: Never  . Drug use: Never    Current Outpatient Medications  Medication Sig Dispense Refill  . amLODipine (NORVASC) 5 MG tablet     . Artificial Tear Solution (SOOTHE XP OP) Place 1 drop into both eyes daily.    Marland Kitchen conjugated estrogens (PREMARIN) vaginal cream Place 1 Applicatorful vaginally 2 (two) times a week.    . cycloSPORINE (RESTASIS) 0.05 % ophthalmic emulsion Place 1 drop into both eyes daily.    Marland Kitchen levothyroxine (SYNTHROID, LEVOTHROID) 50 MCG tablet Take 50 mcg by mouth See admin instructions. Alternates between 50 mcg and 75 mcg .Marland Kitchen 50 mcg taken Sat and Sun     . levothyroxine (SYNTHROID, LEVOTHROID) 75 MCG tablet Take 75 mcg by mouth See admin instructions. Takes 5 days a week Mon - Fri    . lisinopril-hydrochlorothiazide (PRINZIDE,ZESTORETIC) 20-12.5 MG tablet Take 1 tablet by mouth daily.     . metoprolol tartrate (LOPRESSOR) 25 MG tablet Take 25 mg by mouth daily.     . valACYclovir (VALTREX) 500 MG tablet Take 500 mg by mouth daily as needed (breakouts).   0  . White Petrolatum-Mineral Oil (SYSTANE NIGHTTIME OP) Place 1 drop into both eyes at bedtime.     Current Facility-Administered Medications  Medication Dose Route Frequency Provider Last Rate Last Dose  . betamethasone acetate-betamethasone sodium phosphate (CELESTONE) injection 3 mg  3 mg Intramuscular Once Edrick Kins, DPM        Allergies  Allergen Reactions  . Penicillins Nausea And Vomiting    Has patient had a PCN reaction causing immediate rash, facial/tongue/throat swelling, SOB or lightheadedness with hypotension: no Has patient had a PCN reaction causing severe rash involving mucus membranes or skin necrosis: no Has patient had a PCN reaction that required hospitalization: no Has patient had a PCN reaction occurring within the last 10 years: no If all of the above answers are "NO", then may proceed with Cephalosporin use.     Review of Systems   Complaining about some problems with ankle swelling  and distended veins in her leg tissue-recommended keeping the legs elevated as much as possible  Brought a ABI home health screening document showing her ABI in the right leg was 0.82.  Recommended she take 81 mg aspirin daily.  BP 131/68 (BP Location: Right Arm, Patient Position: Sitting, Cuff Size: Normal)   Pulse 70   Temp (!) 96.8 F (36 C)   Resp 18   Ht 5\' 3"  (1.6 m)   Wt 145 lb (65.8 kg)   SpO2 93% Comment: RA  BMI 25.69 kg/m  Physical Exam      Exam    General- alert and comfortable    Neck- no JVD, no cervical adenopathy palpable, no carotid bruit    Lungs- clear without rales, wheezes.  Sternal incision well-healed.   Cor- regular rate and rhythm, no murmur , gallop   Abdomen- soft, non-tender   Extremities - warm, non-tender, minimal edema   Neuro- oriented, appropriate, no focal weakness   Diagnostic Tests: CT scan images performed today pressure reviewed.  No evidence of recurrent neoplasm.  Sternal union complete after sternotomy.  Impression: Doing well following sternotomy and resection of substernal goiter.  No further surgical follow-up needed.     Len Childs, MD [facility] [phone]

## 2018-11-30 IMAGING — DX DG CHEST 1V PORT
1 series · 1 of 1 positions shown · non-contrast
Comparison: 09/11/2017 at [DATE] p.m.

CLINICAL DATA: Substernal thyroid goiter, status post surgery

EXAM:
PORTABLE CHEST 1 VIEW

[chest]
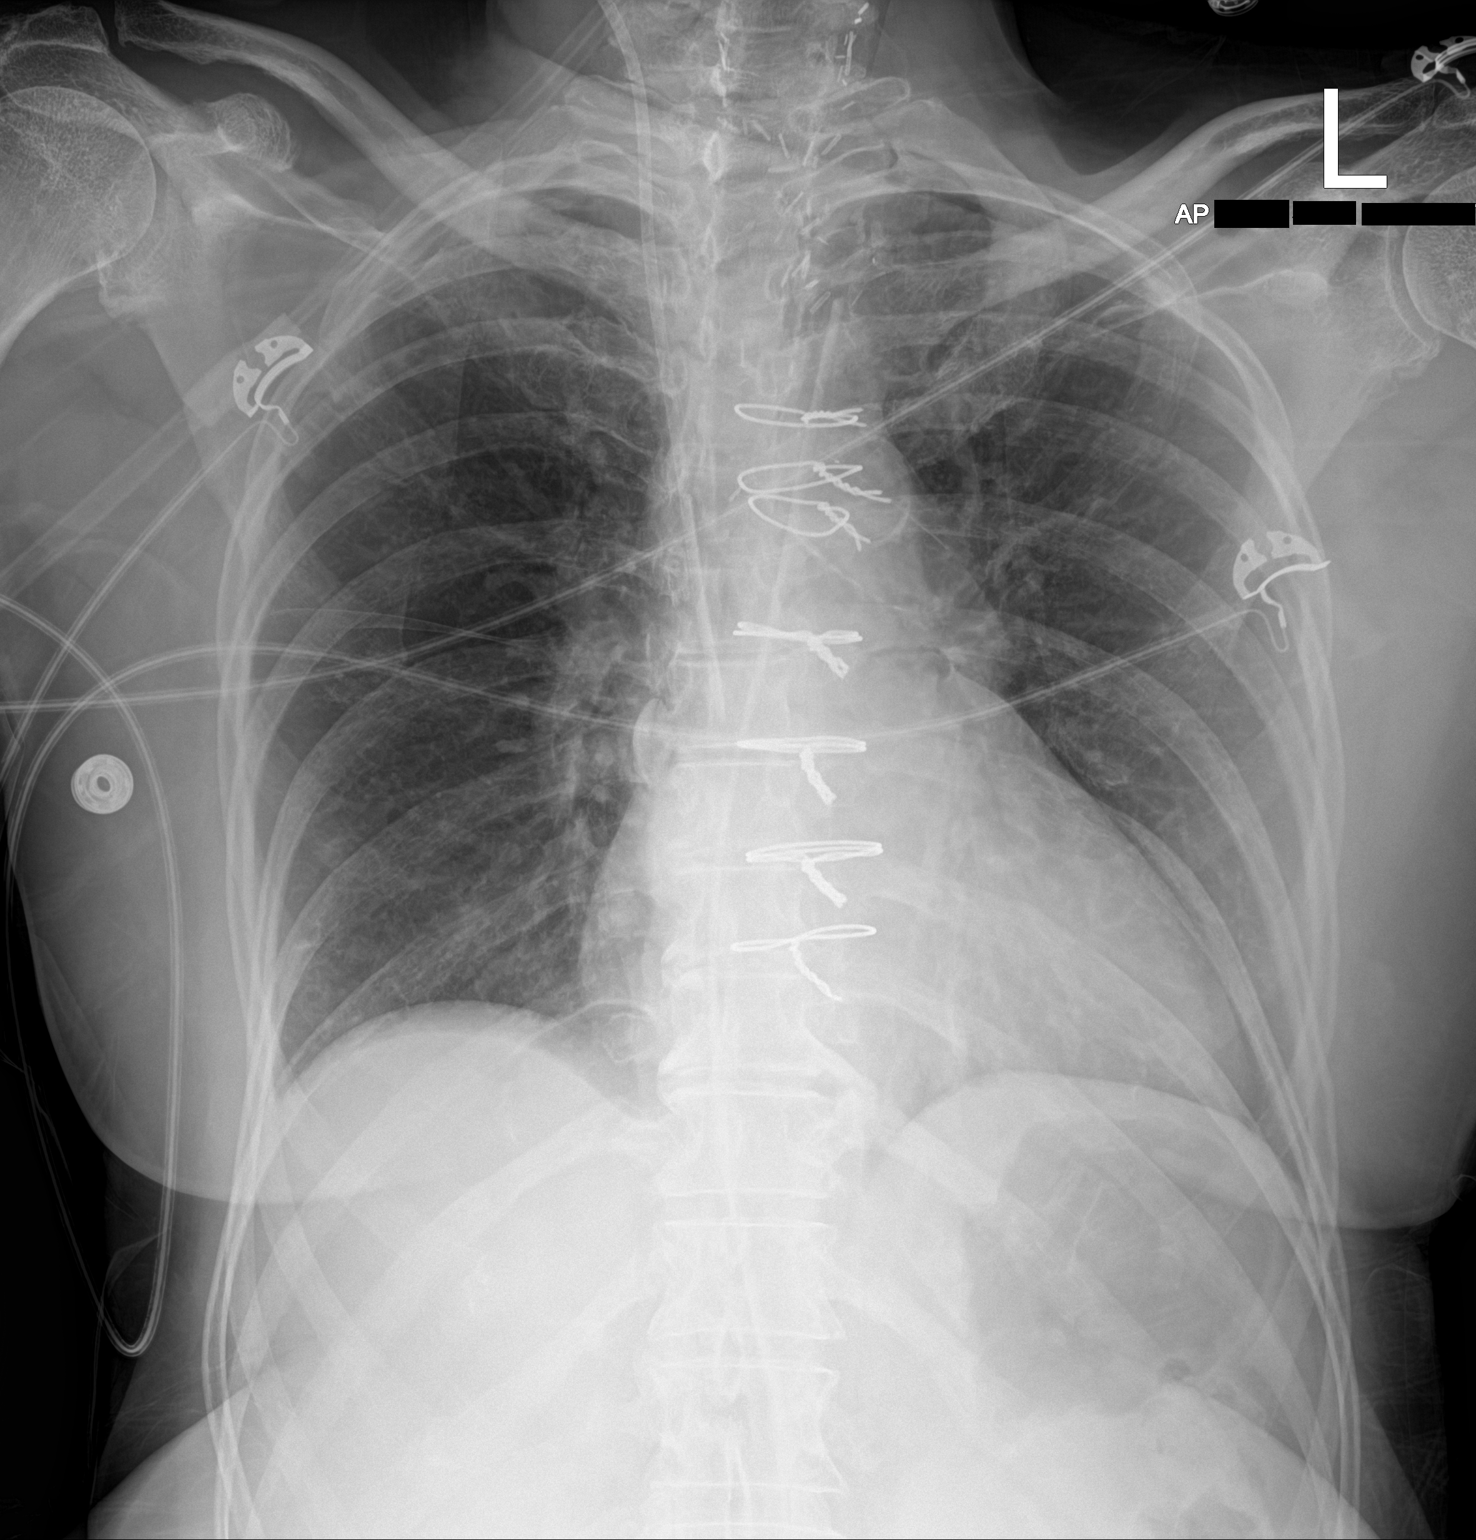

[1 of 1 positions shown; findings below may reference images not displayed]

FINDINGS: Status post median sternotomy with mediastinal drain in place.
Multiple upper mediastinal surgical clips. Right IJ central venous
catheter tip is at the cavoatrial junction. Possible trace right
apical pneumothorax.
IMPRESSION: Status post median sternotomy for goiter resection. Possible trace
right apical pneumothorax.

## 2018-11-30 IMAGING — CR DG CHEST 2V
2 series · 2 of 2 positions shown · non-contrast
Comparison: 03/30/2008

CLINICAL DATA: Preop sternotomy

EXAM:
CHEST - 2 VIEW

[w chest pa]
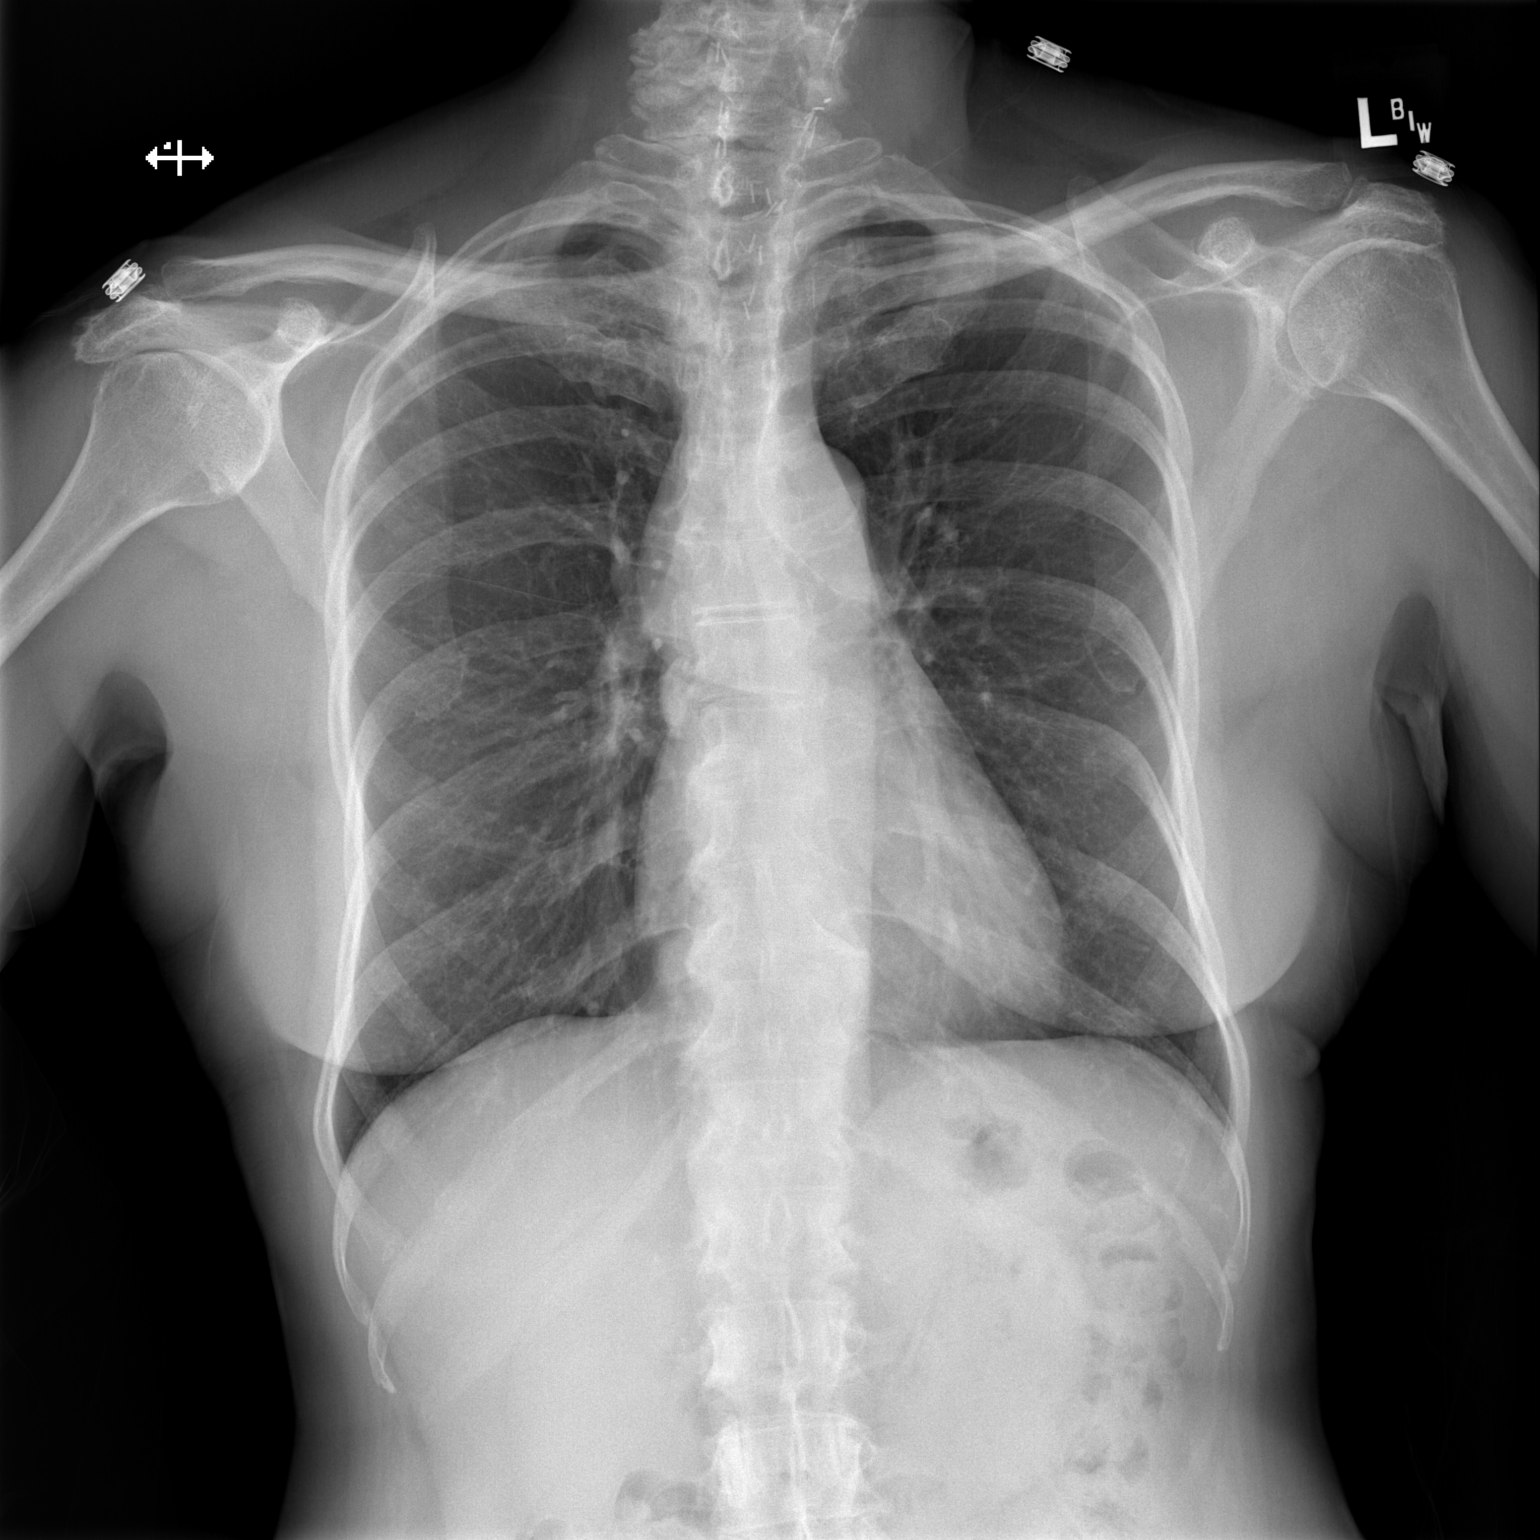

[w chest lat]
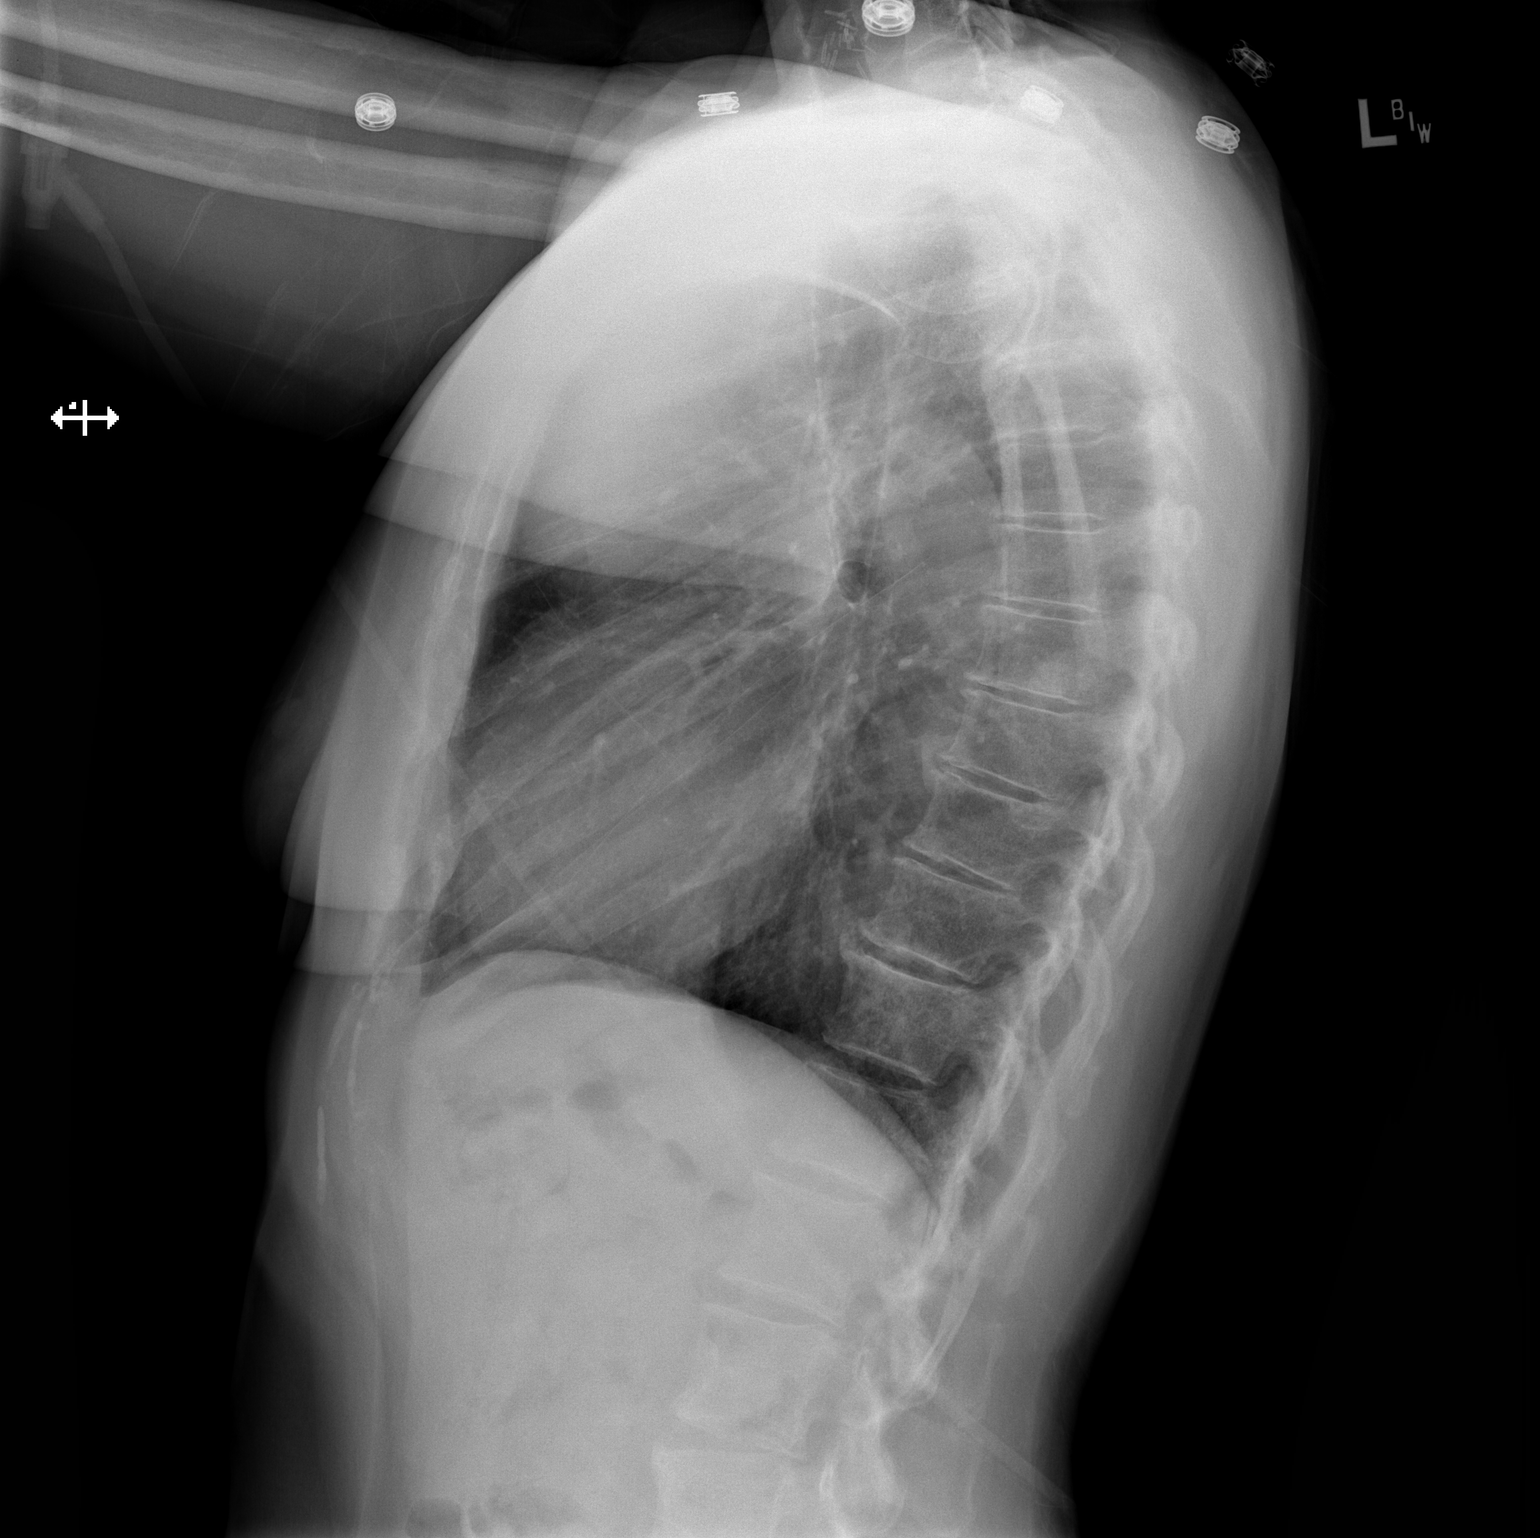

[2 of 2 positions shown; findings below may reference images not displayed]

FINDINGS: The heart size and mediastinal contours are within normal limits.
Both lungs are clear. The visualized skeletal structures are
unremarkable.
IMPRESSION: No active cardiopulmonary disease.

## 2018-12-03 IMAGING — CR DG CHEST 2V
2 series · 2 of 2 positions shown · non-contrast
Comparison: September 13, 2017

CLINICAL DATA: Recent mediastinal mass resection.  Hypertension.

EXAM:
CHEST - 2 VIEW

[chest lat]
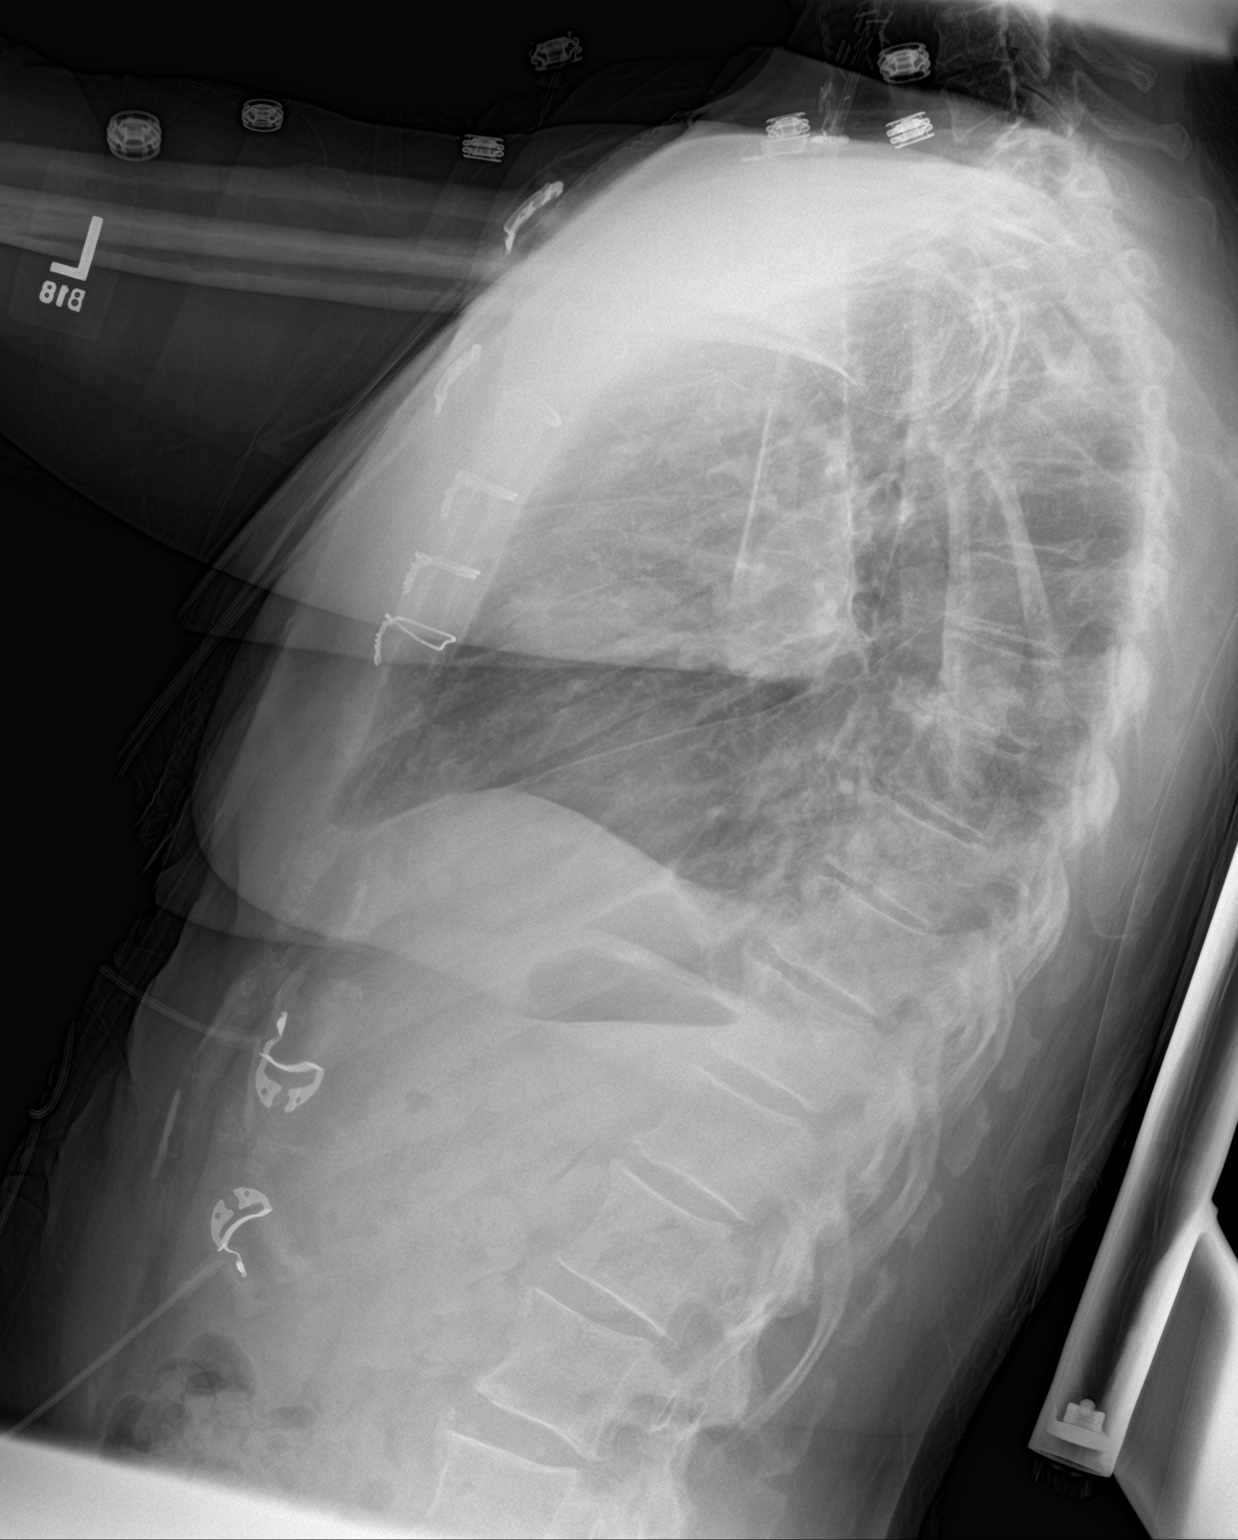

[chest ap]
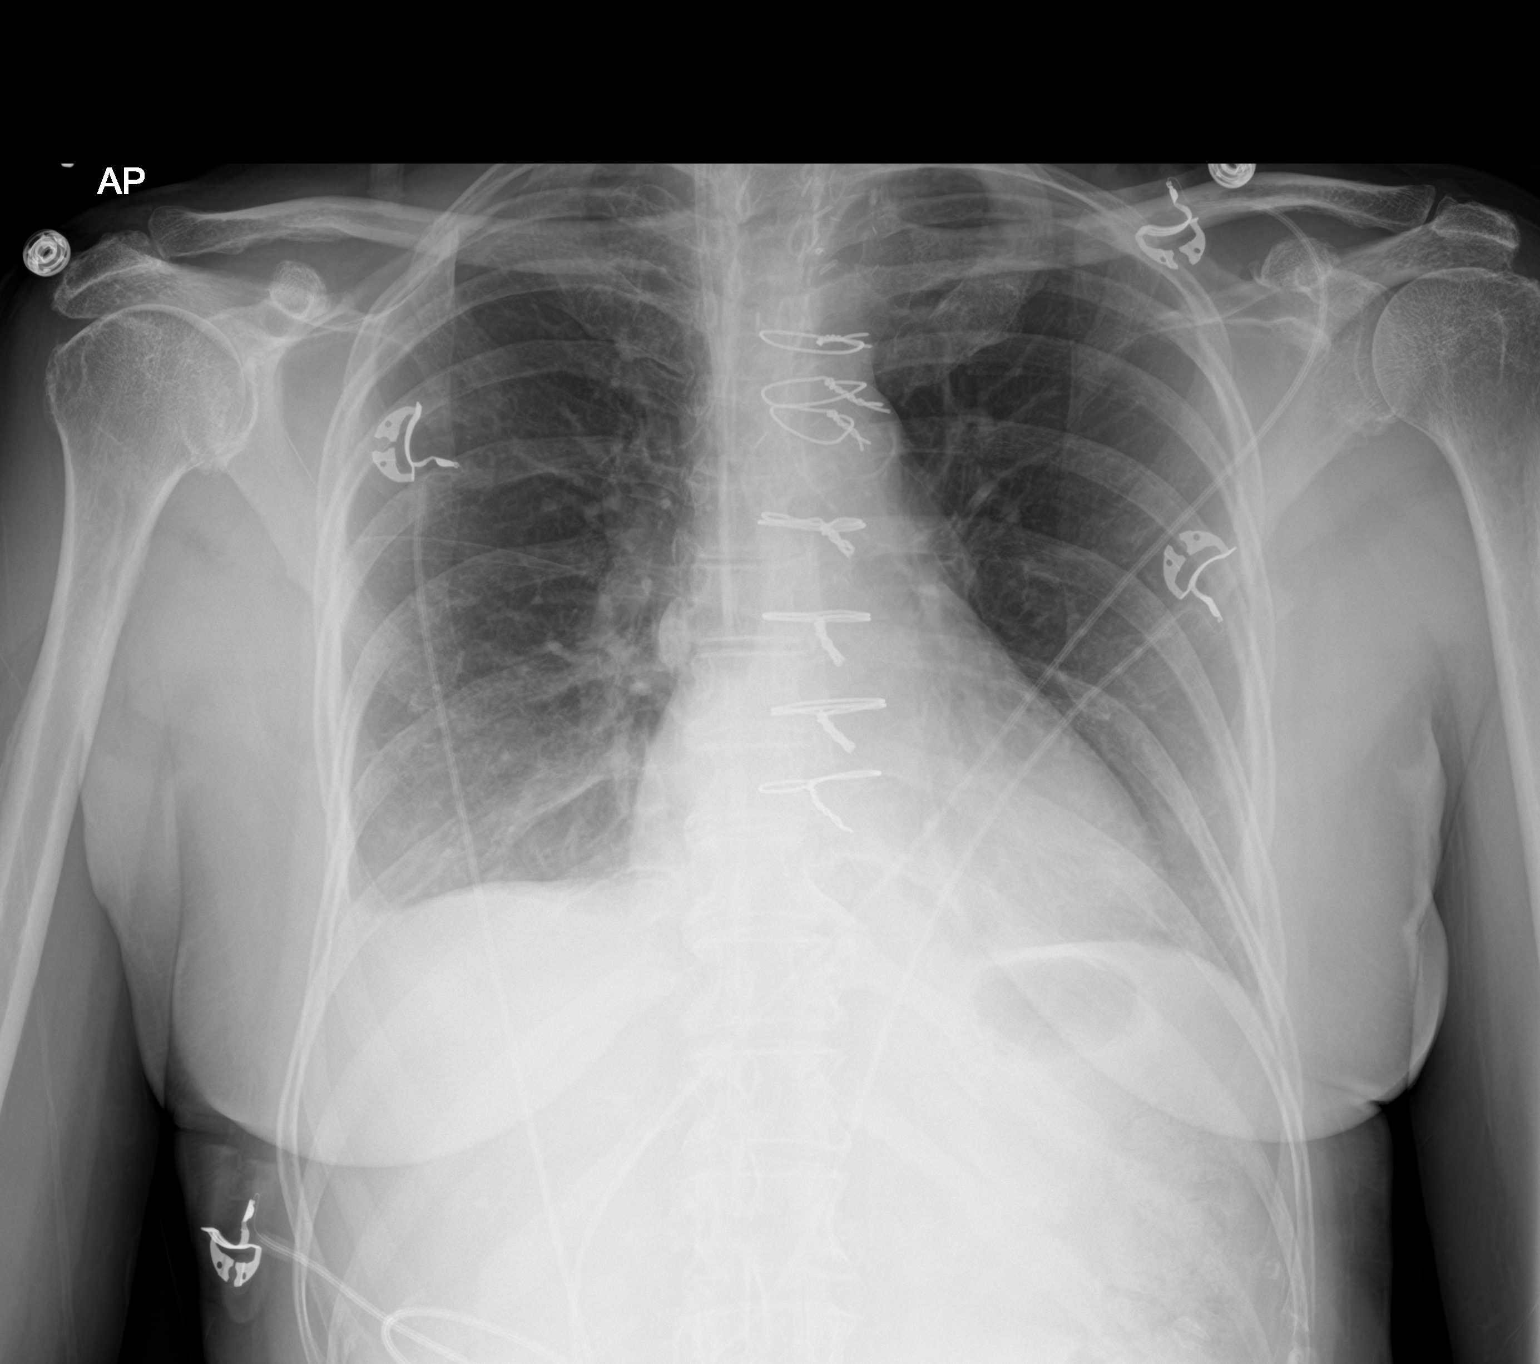

[2 of 2 positions shown; findings below may reference images not displayed]

FINDINGS: Central catheter tip is in the superior vena cava. There is a stable
minimal pneumothorax on the right. There is a small right pleural
effusion. Lungs elsewhere are clear. Heart size and pulmonary
vascular normal. No adenopathy. There is aortic atherosclerosis. No
appreciable bone lesions. There are surgical clips in the thyroid
region.
IMPRESSION: Stable minimal right pneumothorax and small right pleural effusion.
No edema or consolidation. Stable cardiac silhouette. Postoperative
changes noted. There is aortic atherosclerosis.

Aortic Atherosclerosis ([id number]).

## 2018-12-16 DIAGNOSIS — M25562 Pain in left knee: Secondary | ICD-10-CM | POA: Insufficient documentation

## 2019-08-12 ENCOUNTER — Other Ambulatory Visit: Payer: Self-pay | Admitting: Internal Medicine

## 2019-08-12 DIAGNOSIS — Z1231 Encounter for screening mammogram for malignant neoplasm of breast: Secondary | ICD-10-CM

## 2019-09-23 ENCOUNTER — Ambulatory Visit
Admission: RE | Admit: 2019-09-23 | Discharge: 2019-09-23 | Disposition: A | Discharge status: Home / Self Care | Payer: Medicare Other | Source: Ambulatory Visit | Attending: Internal Medicine | Admitting: Internal Medicine

## 2019-09-23 ENCOUNTER — Other Ambulatory Visit: Payer: Self-pay

## 2019-09-23 DIAGNOSIS — Z1231 Encounter for screening mammogram for malignant neoplasm of breast: Secondary | ICD-10-CM

## 2019-10-21 ENCOUNTER — Ambulatory Visit: Payer: Medicare Other | Attending: Internal Medicine

## 2019-10-21 DIAGNOSIS — Z23 Encounter for immunization: Secondary | ICD-10-CM

## 2019-10-21 NOTE — Progress Notes (Signed)
   Covid-19 Vaccination Clinic  Name:  MIYOSHI LIGAS    MRN: [id number] DOB: 19-Dec-1950  10/21/2019  Ms. Filion was observed post Covid-19 immunization for 15 minutes without incident. She was provided with Vaccine Information Sheet and instruction to access the V-Safe system.   Ms. Willis was instructed to call [phone] with any severe reactions post vaccine: Marland Kitchen Difficulty breathing  . Swelling of face and throat  . A fast heartbeat  . A bad rash all over body  . Dizziness and weakness

## 2019-10-31 ENCOUNTER — Other Ambulatory Visit: Payer: Self-pay | Admitting: Internal Medicine

## 2019-11-03 ENCOUNTER — Other Ambulatory Visit: Payer: Self-pay | Admitting: Internal Medicine

## 2019-11-03 DIAGNOSIS — M5416 Radiculopathy, lumbar region: Secondary | ICD-10-CM

## 2019-11-20 ENCOUNTER — Ambulatory Visit
Admission: RE | Admit: 2019-11-20 | Discharge: 2019-11-20 | Disposition: A | Discharge status: Home / Self Care | Payer: Medicare Other | Source: Ambulatory Visit | Attending: Internal Medicine | Admitting: Internal Medicine

## 2019-11-20 ENCOUNTER — Other Ambulatory Visit: Payer: Self-pay

## 2019-11-20 DIAGNOSIS — M5416 Radiculopathy, lumbar region: Secondary | ICD-10-CM

## 2019-11-27 ENCOUNTER — Encounter (HOSPITAL_COMMUNITY): Payer: Medicare Other

## 2019-11-28 ENCOUNTER — Other Ambulatory Visit: Payer: Self-pay

## 2019-11-28 ENCOUNTER — Other Ambulatory Visit (HOSPITAL_COMMUNITY): Payer: Self-pay | Admitting: Internal Medicine

## 2019-11-28 ENCOUNTER — Ambulatory Visit (HOSPITAL_COMMUNITY)
Admission: RE | Admit: 2019-11-28 | Discharge: 2019-11-28 | Disposition: A | Discharge status: Home / Self Care | Payer: Medicare Other | Source: Ambulatory Visit | Attending: Internal Medicine | Admitting: Internal Medicine

## 2019-11-28 DIAGNOSIS — I872 Venous insufficiency (chronic) (peripheral): Secondary | ICD-10-CM | POA: Diagnosis present

## 2019-11-28 DIAGNOSIS — I771 Stricture of artery: Secondary | ICD-10-CM | POA: Insufficient documentation

## 2019-12-16 ENCOUNTER — Other Ambulatory Visit: Payer: Self-pay

## 2019-12-16 ENCOUNTER — Ambulatory Visit: Payer: Medicare Other | Admitting: Diagnostic Neuroimaging

## 2019-12-16 ENCOUNTER — Encounter: Payer: Self-pay | Admitting: Diagnostic Neuroimaging

## 2019-12-16 VITALS — BP 130/76 | HR 75 | Ht 63.0 in | Wt 140.8 lb

## 2019-12-16 DIAGNOSIS — G5711 Meralgia paresthetica, right lower limb: Secondary | ICD-10-CM | POA: Diagnosis not present

## 2019-12-16 DIAGNOSIS — R200 Anesthesia of skin: Secondary | ICD-10-CM

## 2019-12-16 NOTE — Progress Notes (Signed)
[facility]  PATIENT: [name] DOB: 1950-10-28  REFERRING CLINICIAN: Josetta Huddle, MD HISTORY FROM: patient  REASON FOR VISIT: new consult    HISTORICAL  CHIEF COMPLAINT:  Chief Complaint  Patient presents with  . Pain    rm 7  New Pt "constant but more at night- pain in right thigh x 2 months, dopplers NL; MRI was okay"    HISTORY OF PRESENT ILLNESS:   69 year old female here for evaluation of right thigh pain.  Symptoms started to 3 months ago, worse at nighttime.  She describes aching and occasionally burning sensation in her right anterolateral thigh.  Patient has chronic left knee pain for several years, now requiring left knee replacement.  Patient had lower extremity ultrasound and the lower back MRI scan testing which were unremarkable.   REVIEW OF SYSTEMS: Full 14 system review of systems performed and negative with exception of: As per HPI.  ALLERGIES: Allergies  Allergen Reactions  . Penicillins Nausea And Vomiting    Has patient had a PCN reaction causing immediate rash, facial/tongue/throat swelling, SOB or lightheadedness with hypotension: no Has patient had a PCN reaction causing severe rash involving mucus membranes or skin necrosis: no Has patient had a PCN reaction that required hospitalization: no Has patient had a PCN reaction occurring within the last 10 years: no If all of the above answers are "NO", then may proceed with Cephalosporin use.     HOME MEDICATIONS: Outpatient Medications Prior to Visit  Medication Sig Dispense Refill  . amLODipine (NORVASC) 5 MG tablet     . Artificial Tear Solution (SOOTHE XP OP) Place 1 drop into both eyes daily.    Marland Kitchen conjugated estrogens (PREMARIN) vaginal cream Place 1 Applicatorful vaginally 2 (two) times a week.    . cycloSPORINE (RESTASIS) 0.05 % ophthalmic emulsion Place 1 drop into both eyes daily.    Marland Kitchen levothyroxine (SYNTHROID, LEVOTHROID) 50 MCG tablet Take 50 mcg by mouth See  admin instructions. Alternates between 50 mcg and 75 mcg .Marland Kitchen 50 mcg taken Sat and Sun    . levothyroxine (SYNTHROID, LEVOTHROID) 75 MCG tablet Take 75 mcg by mouth See admin instructions. Takes 5 days a week Mon - Fri    . lisinopril-hydrochlorothiazide (PRINZIDE,ZESTORETIC) 20-12.5 MG tablet Take 1 tablet by mouth daily.     . metoprolol tartrate (LOPRESSOR) 25 MG tablet Take 25 mg by mouth daily.     . valACYclovir (VALTREX) 500 MG tablet Take 500 mg by mouth daily as needed (breakouts).   0  . White Petrolatum-Mineral Oil (SYSTANE NIGHTTIME OP) Place 1 drop into both eyes at bedtime.     Facility-Administered Medications Prior to Visit  Medication Dose Route Frequency Provider Last Rate Last Admin  . betamethasone acetate-betamethasone sodium phosphate (CELESTONE) injection 3 mg  3 mg Intramuscular Once Edrick Kins, DPM        PAST MEDICAL HISTORY: Past Medical History:  Diagnosis Date  . Carpal tunnel syndrome   . GERD (gastroesophageal reflux disease)   . Headache    ocassionally  . Hyperlipidemia   . Hypertension   . Insomnia   . Meralgia paresthetica   . PONV (postoperative nausea and vomiting)   . Thyroid cancer, medullary carcinoma ([place])     PAST SURGICAL HISTORY: Past Surgical History:  Procedure Laterality Date  . COLONOSCOPY    . KNEE SURGERY Left    torn cartilage  . RESECTION OF MEDIASTINAL MASS N/A 09/11/2017   Procedure: RESECTION OF MEDIASTINAL MASS;  Surgeon: Prescott Gum, Collier Salina, MD;  Location: [facility];  Service: Thoracic;  Laterality: N/A;  . STERNOTOMY N/A 09/11/2017   Procedure: STERNOTOMY;  Surgeon: Prescott Gum, Collier Salina, MD;  Location: [place];  Service: Thoracic;  Laterality: N/A;  . THYROIDECTOMY      FAMILY HISTORY: Family History  Problem Relation Age of Onset  . Cancer Mother        pancreatic  . Heart attack Father   . Cancer Sister        neck  . Rectal cancer Brother     SOCIAL HISTORY: Social History   Socioeconomic History  . Marital status:  Married    Spouse name: Sonia Side  . Number of children: 4  . Years of education: Not on file  . Highest education level: Associate degree: academic program  Occupational History    Comment: retired, part time  Tobacco Use  . Smoking status: Never Smoker  . Smokeless tobacco: Never Used  Vaping Use  . Vaping Use: Never used  Substance and Sexual Activity  . Alcohol use: Never  . Drug use: Never  . Sexual activity: Not on file  Other Topics Concern  . Not on file  Social History Narrative   Lives with husband   Caffeine- none   Social Determinants of Health   Financial Resource Strain:   . Difficulty of Paying Living Expenses: Not on file  Food Insecurity:   . Worried About Charity fundraiser in the Last Year: Not on file  . Ran Out of Food in the Last Year: Not on file  Transportation Needs:   . Lack of Transportation (Medical): Not on file  . Lack of Transportation (Non-Medical): Not on file  Physical Activity:   . Days of Exercise per Week: Not on file  . Minutes of Exercise per Session: Not on file  Stress:   . Feeling of Stress : Not on file  Social Connections:   . Frequency of Communication with Friends and Family: Not on file  . Frequency of Social Gatherings with Friends and Family: Not on file  . Attends Religious Services: Not on file  . Active Member of Clubs or Organizations: Not on file  . Attends Archivist Meetings: Not on file  . Marital Status: Not on file  Intimate Partner Violence:   . Fear of Current or Ex-Partner: Not on file  . Emotionally Abused: Not on file  . Physically Abused: Not on file  . Sexually Abused: Not on file     PHYSICAL EXAM  GENERAL EXAM/CONSTITUTIONAL: Vitals:  Vitals:   12/16/19 1355  BP: 130/76  Pulse: 75  Weight: 140 lb 12.8 oz (63.9 kg)  Height: 5\' 3"  (1.6 m)     Body mass index is 24.94 kg/m. Wt Readings from Last 3 Encounters:  12/16/19 140 lb 12.8 oz (63.9 kg)  10/02/18 145 lb (65.8 kg)   01/28/18 147 lb 6.4 oz (66.9 kg)     Patient is in no distress; well developed, nourished and groomed; neck is supple  CARDIOVASCULAR:  Examination of carotid arteries is normal; no carotid bruits  Regular rate and rhythm, no murmurs  Examination of peripheral vascular system by observation and palpation is normal  EYES:  Ophthalmoscopic exam of optic discs and posterior segments is normal; no papilledema or hemorrhages  No exam data present  MUSCULOSKELETAL:  Gait, strength, tone, movements noted in Neurologic exam below  NEUROLOGIC: MENTAL STATUS:  No flowsheet data found.  awake, alert,  oriented to person, place and time  recent and remote memory intact  normal attention and concentration  language fluent, comprehension intact, naming intact  fund of knowledge appropriate  CRANIAL NERVE:   2nd - no papilledema on fundoscopic exam  2nd, 3rd, 4th, 6th - pupils equal and reactive to light, visual fields full to confrontation, extraocular muscles intact, no nystagmus  5th - facial sensation symmetric  7th - facial strength symmetric  8th - hearing intact  9th - palate elevates symmetrically, uvula midline  11th - shoulder shrug symmetric  12th - tongue protrusion midline  MOTOR:   normal bulk and tone, full strength in the BUE, BLE  SENSORY:   normal and symmetric to light touch, temperature, vibration  COORDINATION:   finger-nose-finger, fine finger movements normal  REFLEXES:   deep tendon reflexes TRACE and symmetric  GAIT/STATION:   narrow based gait     DIAGNOSTIC DATA (LABS, IMAGING, TESTING) - I reviewed patient records, labs, notes, testing and imaging myself where available.  Lab Results  Component Value Date   WBC 8.2 09/14/2017   HGB 9.7 (L) 09/14/2017   HCT 30.8 (L) 09/14/2017   MCV 93.9 09/14/2017   PLT 206 09/14/2017      Component Value Date/Time   NA 139 09/14/2017 0543   K 3.3 (L) 09/14/2017 0543   CL 100  09/14/2017 0543   CO2 32 09/14/2017 0543   GLUCOSE 102 (H) 09/14/2017 0543   BUN 10 09/14/2017 0543   CREATININE 0.95 09/14/2017 0543   CALCIUM 7.8 (L) 09/14/2017 0543   PROT 5.6 (L) 09/13/2017 0400   ALBUMIN 2.7 (L) 09/13/2017 0400   AST 19 09/13/2017 0400   ALT 14 09/13/2017 0400   ALKPHOS 68 09/13/2017 0400   BILITOT 0.6 09/13/2017 0400   GFRNONAA >60 09/14/2017 0543   GFRAA >60 09/14/2017 0543   No results found for: CHOL, HDL, LDLCALC, LDLDIRECT, TRIG, CHOLHDL No results found for: HGBA1C No results found for: VITAMINB12 No results found for: TSH   11/20/19 MRI lumbar spine - Multilevel facet and disc degeneration with mild scoliosis. No neural impingement to explain history of radiculopathy.    ASSESSMENT AND PLAN  69 y.o. year old female here with right anterior lateral thigh numbness and pain, likely representing meralgia paresthetica.  Dx:  1. Numbness of right anterior thigh   2. Meralgia paraesthetica, right      PLAN:  RIGHT THIGH PAIN (likely meralgia paresthetica) - consider PT evaluation  - consider pain mgmt referral if injections are considered - patient would like to hold off and monitor symptoms for now  Return for return to PCP.    Penni Bombard, MD 87/08/6765, 2:09 PM Certified in Neurology, Neurophysiology and Neuroimaging  [facility] [address] [place], [place] [address] [phone]

## 2019-12-16 NOTE — Patient Instructions (Signed)
RIGHT THIGH PAIN (likely meralgia paresthetica) - consider PT evaluation  - consider pain mgmt referral if injections are considered

## 2020-01-20 DIAGNOSIS — I10 Essential (primary) hypertension: Secondary | ICD-10-CM | POA: Diagnosis not present

## 2020-01-20 DIAGNOSIS — M179 Osteoarthritis of knee, unspecified: Secondary | ICD-10-CM | POA: Diagnosis not present

## 2020-01-20 DIAGNOSIS — E039 Hypothyroidism, unspecified: Secondary | ICD-10-CM | POA: Diagnosis not present

## 2020-01-20 DIAGNOSIS — E890 Postprocedural hypothyroidism: Secondary | ICD-10-CM | POA: Diagnosis not present

## 2020-01-20 DIAGNOSIS — G4700 Insomnia, unspecified: Secondary | ICD-10-CM | POA: Diagnosis not present

## 2020-01-20 DIAGNOSIS — E785 Hyperlipidemia, unspecified: Secondary | ICD-10-CM | POA: Diagnosis not present

## 2020-01-20 DIAGNOSIS — M1712 Unilateral primary osteoarthritis, left knee: Secondary | ICD-10-CM | POA: Diagnosis not present

## 2020-01-20 DIAGNOSIS — M1990 Unspecified osteoarthritis, unspecified site: Secondary | ICD-10-CM | POA: Diagnosis not present

## 2020-01-29 DIAGNOSIS — Z1152 Encounter for screening for COVID-19: Secondary | ICD-10-CM | POA: Diagnosis not present

## 2020-02-03 DIAGNOSIS — E039 Hypothyroidism, unspecified: Secondary | ICD-10-CM | POA: Diagnosis not present

## 2020-02-03 DIAGNOSIS — Z7689 Persons encountering health services in other specified circumstances: Secondary | ICD-10-CM | POA: Diagnosis not present

## 2020-02-03 DIAGNOSIS — Z85850 Personal history of malignant neoplasm of thyroid: Secondary | ICD-10-CM | POA: Diagnosis not present

## 2020-02-04 ENCOUNTER — Other Ambulatory Visit: Payer: Self-pay | Admitting: Physician Assistant

## 2020-02-04 DIAGNOSIS — Z1211 Encounter for screening for malignant neoplasm of colon: Secondary | ICD-10-CM

## 2020-02-04 DIAGNOSIS — K580 Irritable bowel syndrome with diarrhea: Secondary | ICD-10-CM | POA: Diagnosis not present

## 2020-02-27 ENCOUNTER — Ambulatory Visit
Admission: RE | Admit: 2020-02-27 | Discharge: 2020-02-27 | Disposition: A | Discharge status: Home / Self Care | Payer: Medicare Other | Source: Ambulatory Visit | Attending: Physician Assistant | Admitting: Physician Assistant

## 2020-02-27 DIAGNOSIS — Z1211 Encounter for screening for malignant neoplasm of colon: Secondary | ICD-10-CM | POA: Diagnosis not present

## 2020-03-17 DIAGNOSIS — M1712 Unilateral primary osteoarthritis, left knee: Secondary | ICD-10-CM | POA: Diagnosis not present

## 2020-03-25 NOTE — Patient Instructions (Addendum)
DUE TO COVID-19 ONLY ONE VISITOR IS ALLOWED TO COME WITH YOU AND STAY IN THE WAITING ROOM ONLY DURING PRE OP AND PROCEDURE DAY OF SURGERY. THE 1 VISITOR  MAY VISIT WITH YOU AFTER SURGERY IN YOUR PRIVATE ROOM DURING VISITING HOURS ONLY!  YOU NEED TO HAVE A COVID 19 TEST ON: 04/02/20 @ 2:00 PM , THIS TEST MUST BE DONE BEFORE SURGERY,  COVID TESTING SITE [place] [address], IT IS ON THE RIGHT GOING OUT [address] APPROXIMATELY  2 MINUTES PAST [employer] ON THE RIGHT. ONCE YOUR COVID TEST IS COMPLETED,  PLEASE BEGIN THE QUARANTINE INSTRUCTIONS AS OUTLINED IN YOUR HANDOUT.                [name]    Your procedure is scheduled on: 04/06/20   Report to [facility] Main  Entrance   Report to short stay at: 5:30 AM     Call this number if you have problems the morning of surgery [phone]    Remember:   NO SOLID FOOD AFTER MIDNIGHT THE NIGHT PRIOR TO SURGERY. NOTHING BY MOUTH EXCEPT CLEAR LIQUIDS UNTIL: 4:15 AM . PLEASE FINISH ENSURE DRINK PER SURGEON ORDER  WHICH NEEDS TO BE COMPLETED AT: 4:15 AM .  CLEAR LIQUID DIET  Foods Allowed                                                                     Foods Excluded  Coffee and tea, regular and decaf                             liquids that you cannot  Plain Jell-O any favor except red or purple                                           see through such as: Fruit ices (not with fruit pulp)                                     milk, soups, orange juice  Iced Popsicles                                    All solid food Carbonated beverages, regular and diet                                    Cranberry, grape and apple juices Sports drinks like Gatorade Lightly seasoned clear broth or consume(fat free) Sugar, honey syrup  Sample Menu Breakfast                                Lunch  Supper Cranberry juice                    Beef broth                             Chicken broth Jell-O                                     Grape juice                           Apple juice Coffee or tea                        Jell-O                                      Popsicle                                                Coffee or tea                        Coffee or tea  _____________________________________________________________________   BRUSH YOUR TEETH MORNING OF SURGERY AND RINSE YOUR MOUTH OUT, NO CHEWING GUM CANDY OR MINTS.    Take these medicines the morning of surgery with A SIP OF WATER: amlodipine,levothyroxine,metoprolol.Use eye drops as usual.                               You may not have any metal on your body including hair pins and              piercings  Do not wear jewelry, make-up, lotions, powders or perfumes, deodorant             Do not wear nail polish on your fingernails.  Do not shave  48 hours prior to surgery.    Do not bring valuables to the hospital. [place].  Contacts, dentures or bridgework may not be worn into surgery.  Leave suitcase in the car. After surgery it may be brought to your room.     Patients discharged the day of surgery will not be allowed to drive home. IF YOU ARE HAVING SURGERY AND GOING HOME THE SAME DAY, YOU MUST HAVE AN ADULT TO DRIVE YOU HOME AND BE WITH YOU FOR 24 HOURS. YOU MAY GO HOME BY TAXI OR UBER OR ORTHERWISE, BUT AN ADULT MUST ACCOMPANY YOU HOME AND STAY WITH YOU FOR 24 HOURS.  Name and phone number of your driver:  Special Instructions: N/A              Please read over the following fact sheets you were given: _____________________________________________________________________          [facility] - Preparing for Surgery Before surgery, you can play an important role.  Because skin is not sterile, your skin needs to be as free of germs  as possible.  You can reduce the number of germs on your skin by washing with CHG (chlorahexidine gluconate)  soap before surgery.  CHG is an antiseptic cleaner which kills germs and bonds with the skin to continue killing germs even after washing. Please DO NOT use if you have an allergy to CHG or antibacterial soaps.  If your skin becomes reddened/irritated stop using the CHG and inform your nurse when you arrive at Short Stay. Do not shave (including legs and underarms) for at least 48 hours prior to the first CHG shower.  You may shave your face/neck. Please follow these instructions carefully:  1.  Shower with CHG Soap the night before surgery and the  morning of Surgery.  2.  If you choose to wash your hair, wash your hair first as usual with your  normal  shampoo.  3.  After you shampoo, rinse your hair and body thoroughly to remove the  shampoo.                           4.  Use CHG as you would any other liquid soap.  You can apply chg directly  to the skin and wash                       Gently with a scrungie or clean washcloth.  5.  Apply the CHG Soap to your body ONLY FROM THE NECK DOWN.   Do not use on face/ open                           Wound or open sores. Avoid contact with eyes, ears mouth and genitals (private parts).                       Wash face,  Genitals (private parts) with your normal soap.             6.  Wash thoroughly, paying special attention to the area where your surgery  will be performed.  7.  Thoroughly rinse your body with warm water from the neck down.  8.  DO NOT shower/wash with your normal soap after using and rinsing off  the CHG Soap.                9.  Pat yourself dry with a clean towel.            10.  Wear clean pajamas.            11.  Place clean sheets on your bed the night of your first shower and do not  sleep with pets. Day of Surgery : Do not apply any lotions/deodorants the morning of surgery.  Please wear clean clothes to the hospital/surgery center.  FAILURE TO FOLLOW THESE INSTRUCTIONS MAY RESULT IN THE CANCELLATION OF YOUR SURGERY PATIENT  SIGNATURE_________________________________  NURSE SIGNATURE__________________________________  ________________________________________________________________________   [name]  An incentive spirometer is a tool that can help keep your lungs clear and active. This tool measures how well you are filling your lungs with each breath. Taking long deep breaths may help reverse or decrease the chance of developing breathing (pulmonary) problems (especially infection) following:  A long period of time when you are unable to move or be active. BEFORE THE PROCEDURE   If the spirometer includes an indicator to show your best effort, your nurse or  respiratory therapist will set it to a desired goal.  If possible, sit up straight or lean slightly forward. Try not to slouch.  Hold the incentive spirometer in an upright position. INSTRUCTIONS FOR USE  1. Sit on the edge of your bed if possible, or sit up as far as you can in bed or on a chair. 2. Hold the incentive spirometer in an upright position. 3. Breathe out normally. 4. Place the mouthpiece in your mouth and seal your lips tightly around it. 5. Breathe in slowly and as deeply as possible, raising the piston or the ball toward the top of the column. 6. Hold your breath for 3-5 seconds or for as long as possible. Allow the piston or ball to fall to the bottom of the column. 7. Remove the mouthpiece from your mouth and breathe out normally. 8. Rest for a few seconds and repeat Steps 1 through 7 at least 10 times every 1-2 hours when you are awake. Take your time and take a few normal breaths between deep breaths. 9. The spirometer may include an indicator to show your best effort. Use the indicator as a goal to work toward during each repetition. 10. After each set of 10 deep breaths, practice coughing to be sure your lungs are clear. If you have an incision (the cut made at the time of surgery), support your incision when coughing  by placing a pillow or rolled up towels firmly against it. Once you are able to get out of bed, walk around indoors and cough well. You may stop using the incentive spirometer when instructed by your caregiver.  RISKS AND COMPLICATIONS  Take your time so you do not get dizzy or light-headed.  If you are in pain, you may need to take or ask for pain medication before doing incentive spirometry. It is harder to take a deep breath if you are having pain. AFTER USE  Rest and breathe slowly and easily.  It can be helpful to keep track of a log of your progress. Your caregiver can provide you with a simple table to help with this. If you are using the spirometer at home, follow these instructions: [place] IF:   You are having difficultly using the spirometer.  You have trouble using the spirometer as often as instructed.  Your pain medication is not giving enough relief while using the spirometer.  You develop fever of 100.5 F (38.1 C) or higher. SEEK IMMEDIATE MEDICAL CARE IF:   You cough up bloody sputum that had not been present before.  You develop fever of 102 F (38.9 C) or greater.  You develop worsening pain at or near the incision site. MAKE SURE YOU:   Understand these instructions.  Will watch your condition.  Will get help right away if you are not doing well or get worse. Document Released: 05/08/2006 Document Revised: 03/20/2011 Document Reviewed: 07/09/2006 [facility] Patient Information 2014 [place], [place].   ________________________________________________________________________

## 2020-03-26 NOTE — H&P (Signed)
TOTAL KNEE ADMISSION H&P  Patient is being admitted for left total knee arthroplasty.  Subjective:  Chief Complaint:left knee pain.  HPI: [name], 70 y.o. female, has a history of pain and functional disability in the left knee due to arthritis and has failed non-surgical conservative treatments for greater than 12 weeks to includeNSAID's and/or analgesics, corticosteriod injections and activity modification.  Onset of symptoms was gradual, starting 1.5 years ago with gradually worsening course since that time. The patient noted prior procedures on the knee to include  arthroscopy on the left knee(s).  Patient currently rates pain in the left knee(s) at 10 out of 10 with activity. Patient has night pain, worsening of pain with activity and weight bearing, pain that interferes with activities of daily living, pain with passive range of motion, crepitus and joint swelling.  Patient has evidence of periarticular osteophytes and joint space narrowing by imaging studies.There is no active infection.  Risks, benefits and expectations were discussed with the patient.  Risks including but not limited to the risk of anesthesia, blood clots, nerve damage, blood vessel damage, failure of the prosthesis, infection and up to and including death.  Patient understand the risks, benefits and expectations and wishes to proceed with surgery.   D/C Plans:       Home  Post-op Meds:       No Rx given   Tranexamic Acid:      To be given - IV   Decadron:      Is to be given  FYI:      ASA  Norco  DME:   Rx sent for - RW & 3-N-1  PT:   OPPT    Pharmacy: [facility]    Patient Active Problem List   Diagnosis Date Noted  . Mediastinal mass 10/02/2018  . 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 Carpal tunnel syndrome   . GERD (gastroesophageal reflux disease)   . Headache    ocassionally  . Hyperlipidemia   . Hypertension   . Insomnia   .  Meralgia paresthetica   . PONV (postoperative nausea and vomiting)   . Thyroid cancer, medullary carcinoma [facility])     Past Surgical History:  Procedure Laterality Date  . COLONOSCOPY    . KNEE SURGERY Left    torn cartilage  . RESECTION OF MEDIASTINAL MASS N/A 09/11/2017   Procedure: RESECTION OF MEDIASTINAL MASS;  Surgeon: Ivin Poot, MD;  Location: [facility];  Service: Thoracic;  Laterality: N/A;  . STERNOTOMY N/A 09/11/2017   Procedure: STERNOTOMY;  Surgeon: Prescott Gum, Collier Salina, MD;  Location: [place];  Service: Thoracic;  Laterality: N/A;  . THYROIDECTOMY      Current Facility-Administered Medications  Medication Dose Route Frequency Provider Last Rate Last Admin  . betamethasone acetate-betamethasone sodium phosphate (CELESTONE) injection 3 mg  3 mg Intramuscular Once Edrick Kins, DPM       Current Outpatient Medications  Medication Sig Dispense Refill Last Dose  . amLODipine (NORVASC) 5 MG tablet Take 2.5 mg by mouth daily.     Marland Kitchen conjugated estrogens (PREMARIN) vaginal cream Place 1 Applicatorful vaginally 2 (two) times a week.     . cycloSPORINE (RESTASIS) 0.05 % ophthalmic emulsion Place 1 drop into both eyes daily.     Marland Kitchen levothyroxine (SYNTHROID, LEVOTHROID) 50 MCG tablet Take 50 mcg by mouth See admin instructions. Take 50 mcg by mouth on Saturday and Sunday     .  levothyroxine (SYNTHROID, LEVOTHROID) 75 MCG tablet Take 75 mcg by mouth every Monday, Tuesday, Wednesday, Thursday, and Friday. Takes 5 days a week Mon - Fri     . lisinopril-hydrochlorothiazide (PRINZIDE,ZESTORETIC) 20-12.5 MG tablet Take 1 tablet by mouth daily.      . metoprolol tartrate (LOPRESSOR) 25 MG tablet Take 25 mg by mouth 2 (two) times daily.     Vladimir Faster Glycol-Propyl Glycol (SYSTANE OP) Place 1 drop into both eyes in the morning and at bedtime.     . Artificial Tear Solution (SOOTHE XP OP) Place 1 drop into both eyes daily. (Patient not taking: Reported on 03/16/2020)   Not Taking at Unknown time  .  valACYclovir (VALTREX) 500 MG tablet Take 500 mg by mouth daily as needed (breakouts).  (Patient not taking: Reported on 03/16/2020)  0 Not Taking at Unknown time  . White Petrolatum-Mineral Oil (SYSTANE NIGHTTIME OP) Place 1 drop into both eyes at bedtime. (Patient not taking: Reported on 03/16/2020)   Not Taking at Unknown time   Allergies  Allergen Reactions  . Penicillins Nausea And Vomiting    Has patient had a PCN reaction causing immediate rash, facial/tongue/throat swelling, SOB or lightheadedness with hypotension: no Has patient had a PCN reaction causing severe rash involving mucus membranes or skin necrosis: no Has patient had a PCN reaction that required hospitalization: no Has patient had a PCN reaction occurring within the last 10 years: no If all of the above answers are "NO", then may proceed with Cephalosporin use.     Social History   Tobacco Use  . Smoking status: Never Smoker  . Smokeless tobacco: Never Used  Substance Use Topics  . Alcohol use: Never    Family History  Problem Relation Age of Onset  . Cancer Mother        pancreatic  . Heart attack Father   . Cancer Sister        neck  . Rectal cancer Brother      Review of Systems  Constitutional: Negative.   HENT: Negative.   Eyes: Negative.   Respiratory: Negative.   Cardiovascular: Negative.   Gastrointestinal: Positive for heartburn.  Genitourinary: Negative.   Musculoskeletal: Positive for joint pain.  Skin: Negative.   Neurological: Positive for headaches.  Endo/Heme/Allergies: Negative.   Psychiatric/Behavioral: Negative.     Objective:  Physical Exam Constitutional:      Appearance: She is well-developed.  HENT:     Head: Normocephalic.  Eyes:     Pupils: Pupils are equal, round, and reactive to light.  Neck:     Thyroid: No thyromegaly.     Vascular: No JVD.     Trachea: No tracheal deviation.  Cardiovascular:     Rate and Rhythm: Normal rate and regular rhythm.  Pulmonary:      Effort: Pulmonary effort is normal. No respiratory distress.     Breath sounds: Normal breath sounds. No wheezing.  Abdominal:     Palpations: Abdomen is soft.     Tenderness: There is no abdominal tenderness. There is no guarding.  Musculoskeletal:     Cervical back: Neck supple.     Left knee: Swelling and bony tenderness present. No erythema or ecchymosis. Tenderness present.  Lymphadenopathy:     Cervical: No cervical adenopathy.  Skin:    General: Skin is warm and dry.  Neurological:     Mental Status: She is alert and oriented to person, place, and time.      Labs:  Estimated  body mass index is 24.94 kg/m as calculated from the following:   Height as of 12/16/19: 5\' 3"  (1.6 m).   Weight as of 12/16/19: 63.9 kg.   Imaging Review Plain radiographs demonstrate severe degenerative joint disease of the left knee. The bone quality appears to be good for age and reported activity level.      Assessment/Plan:  End stage arthritis, left knee   The patient history, physical examination, clinical judgment of the provider and imaging studies are consistent with end stage degenerative joint disease of the left knee(s) and total knee arthroplasty is deemed medically necessary. The treatment options including medical management, injection therapy arthroscopy and arthroplasty were discussed at length. The risks and benefits of total knee arthroplasty were presented and reviewed. The risks due to aseptic loosening, infection, stiffness, patella tracking problems, thromboembolic complications and other imponderables were discussed. The patient acknowledged the explanation, agreed to proceed with the plan and consent was signed. Patient is being admitted for treatment for surgery, pain control, PT, OT, prophylactic antibiotics, VTE prophylaxis, progressive ambulation and ADL's and discharge planning. The patient is planning to be discharged home.     Patient's anticipated LOS is less than  2 midnights, meeting these requirements: - Lives within 1 hour of care - Has a competent adult at home to recover with post-op recover - NO history of  - Chronic pain requiring opiods  - Diabetes  - Coronary Artery Disease  - Heart failure  - Heart attack  - Stroke  - DVT/VTE  - Cardiac arrhythmia  - Respiratory Failure/COPD  - Renal failure  - Anemia  - Advanced Liver disease

## 2020-03-29 ENCOUNTER — Encounter (HOSPITAL_COMMUNITY): Payer: Self-pay

## 2020-03-29 ENCOUNTER — Encounter (HOSPITAL_COMMUNITY)
Admission: RE | Admit: 2020-03-29 | Discharge: 2020-03-29 | Disposition: A | Discharge status: Home / Self Care | Payer: Medicare Other | Source: Ambulatory Visit | Attending: Orthopedic Surgery | Admitting: Orthopedic Surgery

## 2020-03-29 ENCOUNTER — Other Ambulatory Visit: Payer: Self-pay

## 2020-03-29 DIAGNOSIS — Z01818 Encounter for other preprocedural examination: Secondary | ICD-10-CM | POA: Diagnosis not present

## 2020-03-29 HISTORY — DX: Hypothyroidism, unspecified: E03.9

## 2020-03-29 HISTORY — DX: Unspecified osteoarthritis, unspecified site: M19.90

## 2020-03-29 LAB — CBC
HCT: 40.4 % (ref 36.0–46.0)
Hemoglobin: 13 g/dL (ref 12.0–15.0)
MCH: 29.4 pg (ref 26.0–34.0)
MCHC: 32.2 g/dL (ref 30.0–36.0)
MCV: 91.4 fL (ref 80.0–100.0)
Platelets: 261 10*3/uL (ref 150–400)
RBC: 4.42 MIL/uL (ref 3.87–5.11)
RDW: 13.8 % (ref 11.5–15.5)
WBC: 6.6 10*3/uL (ref 4.0–10.5)
nRBC: 0 % (ref 0.0–0.2)

## 2020-03-29 LAB — BASIC METABOLIC PANEL
Anion gap: 9 (ref 5–15)
BUN: 15 mg/dL (ref 8–23)
CO2: 29 mmol/L (ref 22–32)
Calcium: 9 mg/dL (ref 8.9–10.3)
Chloride: 101 mmol/L (ref 98–111)
Creatinine, Ser: 1.03 mg/dL — ABNORMAL HIGH (ref 0.44–1.00)
GFR, Estimated: 58 mL/min — ABNORMAL LOW (ref >60)
Glucose, Bld: 108 mg/dL — ABNORMAL HIGH (ref 70–99)
Potassium: 4.1 mmol/L (ref 3.5–5.1)
Sodium: 139 mmol/L (ref 135–145)

## 2020-03-29 LAB — SURGICAL PCR SCREEN
MRSA, PCR: NEGATIVE
Staphylococcus aureus: NEGATIVE

## 2020-03-29 NOTE — Progress Notes (Addendum)
COVID Vaccine Completed: yes Date COVID Vaccine completed: 10/21/19 [place] COVID vaccine manufacturer: Pfizer      PCP - Dr. Josetta Huddle Cardiologist -   Chest x-ray -  EKG -  Stress Test -  ECHO - 09/03/17 Cardiac Cath -  Pacemaker/ICD device last checked:  Sleep Study -  CPAP -   Fasting Blood Sugar -  Checks Blood Sugar _____ times a day  Blood Thinner Instructions: Aspirin Instructions: Last Dose:  Anesthesia review:   Patient denies shortness of breath, fever, cough and chest pain at PAT appointment   Patient verbalized understanding of instructions that were given to them at the PAT appointment. Patient was also instructed that they will need to review over the PAT instructions again at home before surgery.

## 2020-03-29 NOTE — Progress Notes (Signed)
Lab. Results: Hemoglobin: 9.7.  HCT: 30.8

## 2020-04-02 ENCOUNTER — Other Ambulatory Visit (HOSPITAL_COMMUNITY)
Admission: RE | Admit: 2020-04-02 | Discharge: 2020-04-02 | Disposition: A | Discharge status: Home / Self Care | Payer: Medicare Other | Source: Ambulatory Visit | Attending: Orthopedic Surgery | Admitting: Orthopedic Surgery

## 2020-04-02 DIAGNOSIS — Z01812 Encounter for preprocedural laboratory examination: Secondary | ICD-10-CM | POA: Insufficient documentation

## 2020-04-02 DIAGNOSIS — Z20822 Contact with and (suspected) exposure to covid-19: Secondary | ICD-10-CM | POA: Diagnosis not present

## 2020-04-02 LAB — SARS CORONAVIRUS 2 (TAT 6-24 HRS): SARS Coronavirus 2: NEGATIVE

## 2020-04-03 DIAGNOSIS — M1712 Unilateral primary osteoarthritis, left knee: Secondary | ICD-10-CM | POA: Diagnosis not present

## 2020-04-03 DIAGNOSIS — E785 Hyperlipidemia, unspecified: Secondary | ICD-10-CM | POA: Diagnosis not present

## 2020-04-03 DIAGNOSIS — E890 Postprocedural hypothyroidism: Secondary | ICD-10-CM | POA: Diagnosis not present

## 2020-04-03 DIAGNOSIS — M1990 Unspecified osteoarthritis, unspecified site: Secondary | ICD-10-CM | POA: Diagnosis not present

## 2020-04-03 DIAGNOSIS — E039 Hypothyroidism, unspecified: Secondary | ICD-10-CM | POA: Diagnosis not present

## 2020-04-03 DIAGNOSIS — G4700 Insomnia, unspecified: Secondary | ICD-10-CM | POA: Diagnosis not present

## 2020-04-03 DIAGNOSIS — I10 Essential (primary) hypertension: Secondary | ICD-10-CM | POA: Diagnosis not present

## 2020-04-03 DIAGNOSIS — M179 Osteoarthritis of knee, unspecified: Secondary | ICD-10-CM | POA: Diagnosis not present

## 2020-04-05 NOTE — Anesthesia Preprocedure Evaluation (Addendum)
Anesthesia Evaluation  Patient identified by MRN, date of birth, ID band Patient awake    Reviewed: Allergy & Precautions, NPO status , Patient's Chart, lab work & pertinent test results  History of Anesthesia Complications (+) PONV and history of anesthetic complications  Airway Mallampati: I  TM Distance: >3 FB Neck ROM: Full    Dental no notable dental hx. (+) Partial Upper, Partial Lower, Poor Dentition, Missing,    Pulmonary neg pulmonary ROS,    Pulmonary exam normal breath sounds clear to auscultation       Cardiovascular hypertension, Pt. on medications and Pt. on home beta blockers Normal cardiovascular exam Rhythm:Regular Rate:Normal     Neuro/Psych  Headaches,  Neuromuscular disease negative psych ROS   GI/Hepatic Neg liver ROS, GERD  Medicated,  Endo/Other  Hypothyroidism   Renal/GU negative Renal ROS  negative genitourinary   Musculoskeletal  (+) Arthritis , Osteoarthritis,    Abdominal   Peds negative pediatric ROS (+)  Hematology negative hematology ROS (+)   Anesthesia Other Findings   Reproductive/Obstetrics negative OB ROS                            Anesthesia Physical  Anesthesia Plan  ASA: III  Anesthesia Plan: Spinal and MAC   Post-op Pain Management:  Regional for Post-op pain   Induction: Intravenous  PONV Risk Score and Plan: Propofol infusion  Airway Management Planned: Nasal Cannula and Natural Airway  Additional Equipment: None  Intra-op Plan:   Post-operative Plan:   Informed Consent: I have reviewed the patients History and Physical, chart, labs and discussed the procedure including the risks, benefits and alternatives for the proposed anesthesia with the patient or authorized representative who has indicated his/her understanding and acceptance.     Dental advisory given  Plan Discussed with: CRNA and Anesthesiologist  Anesthesia Plan  Comments: (MRI LUMBAR SPINE 11/21 IMPRESSION: Multilevel facet and disc degeneration with mild scoliosis. No neural impingement to explain history of radiculopathy.)        Anesthesia Quick Evaluation

## 2020-04-06 ENCOUNTER — Ambulatory Visit (HOSPITAL_COMMUNITY): Payer: Medicare Other | Admitting: Registered Nurse

## 2020-04-06 ENCOUNTER — Encounter (HOSPITAL_COMMUNITY): Payer: Self-pay | Admitting: Orthopedic Surgery

## 2020-04-06 ENCOUNTER — Encounter (HOSPITAL_COMMUNITY)
Admission: RE | Disposition: A | Discharge status: Home / Self Care | Payer: Self-pay | Source: Home / Self Care | Attending: Orthopedic Surgery

## 2020-04-06 ENCOUNTER — Observation Stay (HOSPITAL_COMMUNITY)
Admission: RE | Admit: 2020-04-06 | Discharge: 2020-04-07 | Disposition: A | Discharge status: Home / Self Care | Payer: Medicare Other | Attending: Orthopedic Surgery | Admitting: Orthopedic Surgery

## 2020-04-06 ENCOUNTER — Other Ambulatory Visit: Payer: Self-pay

## 2020-04-06 DIAGNOSIS — E039 Hypothyroidism, unspecified: Secondary | ICD-10-CM | POA: Diagnosis not present

## 2020-04-06 DIAGNOSIS — M1712 Unilateral primary osteoarthritis, left knee: Principal | ICD-10-CM | POA: Insufficient documentation

## 2020-04-06 DIAGNOSIS — M25562 Pain in left knee: Secondary | ICD-10-CM | POA: Diagnosis present

## 2020-04-06 DIAGNOSIS — I10 Essential (primary) hypertension: Secondary | ICD-10-CM | POA: Insufficient documentation

## 2020-04-06 DIAGNOSIS — G8918 Other acute postprocedural pain: Secondary | ICD-10-CM | POA: Diagnosis not present

## 2020-04-06 DIAGNOSIS — Z85850 Personal history of malignant neoplasm of thyroid: Secondary | ICD-10-CM | POA: Insufficient documentation

## 2020-04-06 DIAGNOSIS — Z79899 Other long term (current) drug therapy: Secondary | ICD-10-CM | POA: Insufficient documentation

## 2020-04-06 DIAGNOSIS — Z96652 Presence of left artificial knee joint: Secondary | ICD-10-CM

## 2020-04-06 HISTORY — PX: TOTAL KNEE ARTHROPLASTY: SHX125

## 2020-04-06 LAB — TYPE AND SCREEN
ABO/RH(D): B POS
Antibody Screen: NEGATIVE

## 2020-04-06 SURGERY — ARTHROPLASTY, KNEE, TOTAL
Anesthesia: Monitor Anesthesia Care | Site: Knee | Laterality: Left

## 2020-04-06 MED ORDER — KETOROLAC TROMETHAMINE 30 MG/ML IJ SOLN
INTRAMUSCULAR | Status: DC | PRN
  Administered 2020-04-06: 30 mg

## 2020-04-06 MED ORDER — MIDAZOLAM HCL 5 MG/5ML IJ SOLN
INTRAMUSCULAR | Status: DC | PRN
  Administered 2020-04-06 (×2): 1 mg via INTRAVENOUS

## 2020-04-06 MED ORDER — MENTHOL 3 MG MT LOZG: 1.0000 | LOZENGE | OROMUCOSAL | Status: DC | PRN

## 2020-04-06 MED ORDER — OXYCODONE HCL 5 MG/5ML PO SOLN: 5.0000 mg | Freq: Once | ORAL | Status: DC | PRN

## 2020-04-06 MED ORDER — ORAL CARE MOUTH RINSE: 15.0000 mL | Freq: Once | OROMUCOSAL | Status: AC

## 2020-04-06 MED ORDER — POLYVINYL ALCOHOL 1.4 % OP SOLN
Freq: Two times a day (BID) | OPHTHALMIC | Status: DC
  Filled 2020-04-06: qty 15

## 2020-04-06 MED ORDER — HYDROCODONE-ACETAMINOPHEN 7.5-325 MG PO TABS
1.0000 | ORAL_TABLET | ORAL | Status: DC | PRN
  Administered 2020-04-06: 1 via ORAL
  Administered 2020-04-06 – 2020-04-07 (×5): 2 via ORAL
  Filled 2020-04-06 (×4): qty 2
  Filled 2020-04-06: qty 1
  Filled 2020-04-06: qty 2

## 2020-04-06 MED ORDER — PROPOFOL 500 MG/50ML IV EMUL
INTRAVENOUS | Status: DC | PRN
  Administered 2020-04-06: 75 ug/kg/min via INTRAVENOUS

## 2020-04-06 MED ORDER — VANCOMYCIN HCL 1000 MG IV SOLR
INTRAVENOUS | Status: AC
  Filled 2020-04-06: qty 1000

## 2020-04-06 MED ORDER — CYCLOSPORINE 0.05 % OP EMUL
1.0000 [drp] | Freq: Every day | OPHTHALMIC | Status: DC
  Administered 2020-04-07: 1 [drp] via OPHTHALMIC
  Filled 2020-04-06 (×2): qty 1

## 2020-04-06 MED ORDER — SODIUM CHLORIDE (PF) 0.9 % IJ SOLN
INTRAMUSCULAR | Status: DC | PRN
  Administered 2020-04-06: 30 mL

## 2020-04-06 MED ORDER — DEXAMETHASONE SODIUM PHOSPHATE 10 MG/ML IJ SOLN
10.0000 mg | Freq: Once | INTRAMUSCULAR | Status: AC
  Administered 2020-04-07: 10 mg via INTRAVENOUS
  Filled 2020-04-06: qty 1

## 2020-04-06 MED ORDER — BUPIVACAINE-EPINEPHRINE (PF) 0.25% -1:200000 IJ SOLN
INTRAMUSCULAR | Status: AC
  Filled 2020-04-06: qty 30

## 2020-04-06 MED ORDER — LISINOPRIL 20 MG PO TABS
20.0000 mg | ORAL_TABLET | Freq: Every day | ORAL | Status: DC
  Administered 2020-04-06 – 2020-04-07 (×2): 20 mg via ORAL
  Filled 2020-04-06 (×2): qty 1

## 2020-04-06 MED ORDER — FENTANYL CITRATE (PF) 100 MCG/2ML IJ SOLN: 25.0000 ug | INTRAMUSCULAR | Status: DC | PRN

## 2020-04-06 MED ORDER — FERROUS SULFATE 325 (65 FE) MG PO TABS
325.0000 mg | ORAL_TABLET | Freq: Three times a day (TID) | ORAL | Status: DC
  Administered 2020-04-06 – 2020-04-07 (×4): 325 mg via ORAL
  Filled 2020-04-06 (×4): qty 1

## 2020-04-06 MED ORDER — ROPIVACAINE HCL 7.5 MG/ML IJ SOLN
INTRAMUSCULAR | Status: DC | PRN
  Administered 2020-04-06: 25 mL via PERINEURAL

## 2020-04-06 MED ORDER — METHOCARBAMOL 500 MG IVPB - SIMPLE MED
500.0000 mg | Freq: Four times a day (QID) | INTRAVENOUS | Status: DC | PRN
  Filled 2020-04-06: qty 50

## 2020-04-06 MED ORDER — HYDROCHLOROTHIAZIDE 12.5 MG PO CAPS
12.5000 mg | ORAL_CAPSULE | Freq: Every day | ORAL | Status: DC
  Administered 2020-04-06 – 2020-04-07 (×2): 12.5 mg via ORAL
  Filled 2020-04-06 (×2): qty 1

## 2020-04-06 MED ORDER — PHENOL 1.4 % MT LIQD: 1.0000 | OROMUCOSAL | Status: DC | PRN

## 2020-04-06 MED ORDER — ACETAMINOPHEN 160 MG/5ML PO SOLN
325.0000 mg | ORAL | Status: DC | PRN
Start: 2020-04-06 — End: 2020-04-06

## 2020-04-06 MED ORDER — ONDANSETRON HCL 4 MG PO TABS: 4.0000 mg | ORAL_TABLET | Freq: Four times a day (QID) | ORAL | Status: DC | PRN

## 2020-04-06 MED ORDER — STERILE WATER FOR IRRIGATION IR SOLN
Status: DC | PRN
  Administered 2020-04-06: 2000 mL

## 2020-04-06 MED ORDER — LISINOPRIL-HYDROCHLOROTHIAZIDE 20-12.5 MG PO TABS: 1.0000 | ORAL_TABLET | Freq: Every day | ORAL | Status: DC

## 2020-04-06 MED ORDER — LACTATED RINGERS IV SOLN: INTRAVENOUS | Status: DC

## 2020-04-06 MED ORDER — SODIUM CHLORIDE 0.9 % IV SOLN: INTRAVENOUS | Status: DC

## 2020-04-06 MED ORDER — FENTANYL CITRATE (PF) 100 MCG/2ML IJ SOLN
INTRAMUSCULAR | Status: DC | PRN
  Administered 2020-04-06: 50 ug via INTRAVENOUS

## 2020-04-06 MED ORDER — TRANEXAMIC ACID-NACL 1000-0.7 MG/100ML-% IV SOLN
1000.0000 mg | INTRAVENOUS | Status: AC
  Administered 2020-04-06: 1000 mg via INTRAVENOUS
  Filled 2020-04-06: qty 100

## 2020-04-06 MED ORDER — MIDAZOLAM HCL 2 MG/2ML IJ SOLN
INTRAMUSCULAR | Status: AC
  Filled 2020-04-06: qty 2

## 2020-04-06 MED ORDER — DEXAMETHASONE SODIUM PHOSPHATE 10 MG/ML IJ SOLN
INTRAMUSCULAR | Status: DC | PRN
  Administered 2020-04-06: 10 mg

## 2020-04-06 MED ORDER — ASPIRIN 81 MG PO CHEW
81.0000 mg | CHEWABLE_TABLET | Freq: Two times a day (BID) | ORAL | Status: DC
  Administered 2020-04-06 – 2020-04-07 (×2): 81 mg via ORAL
  Filled 2020-04-06 (×2): qty 1

## 2020-04-06 MED ORDER — ONDANSETRON HCL 4 MG/2ML IJ SOLN
INTRAMUSCULAR | Status: AC
  Filled 2020-04-06: qty 2

## 2020-04-06 MED ORDER — ONDANSETRON HCL 4 MG/2ML IJ SOLN
4.0000 mg | Freq: Four times a day (QID) | INTRAMUSCULAR | Status: DC | PRN
  Administered 2020-04-06 – 2020-04-07 (×2): 4 mg via INTRAVENOUS
  Filled 2020-04-06 (×2): qty 2

## 2020-04-06 MED ORDER — ACETAMINOPHEN 325 MG PO TABS
325.0000 mg | ORAL_TABLET | ORAL | Status: DC | PRN
Start: 2020-04-06 — End: 2020-04-06

## 2020-04-06 MED ORDER — ALUM & MAG HYDROXIDE-SIMETH 200-200-20 MG/5ML PO SUSP: 30.0000 mL | ORAL | Status: DC | PRN

## 2020-04-06 MED ORDER — SODIUM CHLORIDE 0.9 % IR SOLN
Status: DC | PRN
  Administered 2020-04-06: 1000 mL

## 2020-04-06 MED ORDER — BISACODYL 10 MG RE SUPP: 10.0000 mg | Freq: Every day | RECTAL | Status: DC | PRN

## 2020-04-06 MED ORDER — CHLORHEXIDINE GLUCONATE 0.12 % MT SOLN
15.0000 mL | Freq: Once | OROMUCOSAL | Status: AC
  Administered 2020-04-06: 15 mL via OROMUCOSAL

## 2020-04-06 MED ORDER — PROPOFOL 10 MG/ML IV BOLUS
INTRAVENOUS | Status: DC | PRN
  Administered 2020-04-06 (×2): 10 mg via INTRAVENOUS

## 2020-04-06 MED ORDER — PHENYLEPHRINE HCL-NACL 10-0.9 MG/250ML-% IV SOLN
INTRAVENOUS | Status: DC | PRN
  Administered 2020-04-06: 35 ug/min via INTRAVENOUS

## 2020-04-06 MED ORDER — BUPIVACAINE-EPINEPHRINE (PF) 0.25% -1:200000 IJ SOLN
INTRAMUSCULAR | Status: DC | PRN
  Administered 2020-04-06: 30 mL

## 2020-04-06 MED ORDER — CEFAZOLIN SODIUM-DEXTROSE 2-4 GM/100ML-% IV SOLN
2.0000 g | INTRAVENOUS | Status: AC
  Administered 2020-04-06: 2 g via INTRAVENOUS
  Filled 2020-04-06: qty 100

## 2020-04-06 MED ORDER — ONDANSETRON HCL 4 MG/2ML IJ SOLN
4.0000 mg | Freq: Once | INTRAMUSCULAR | Status: AC | PRN
  Administered 2020-04-06: 4 mg via INTRAVENOUS

## 2020-04-06 MED ORDER — METOPROLOL TARTRATE 25 MG PO TABS
25.0000 mg | ORAL_TABLET | Freq: Two times a day (BID) | ORAL | Status: DC
  Administered 2020-04-07: 25 mg via ORAL
  Filled 2020-04-06: qty 1

## 2020-04-06 MED ORDER — FENTANYL CITRATE (PF) 100 MCG/2ML IJ SOLN
INTRAMUSCULAR | Status: AC
  Filled 2020-04-06: qty 2

## 2020-04-06 MED ORDER — DIPHENHYDRAMINE HCL 12.5 MG/5ML PO ELIX
12.5000 mg | ORAL_SOLUTION | ORAL | Status: DC | PRN
Start: 2020-04-06 — End: 2020-04-07

## 2020-04-06 MED ORDER — METOCLOPRAMIDE HCL 5 MG/ML IJ SOLN: 5.0000 mg | Freq: Three times a day (TID) | INTRAMUSCULAR | Status: DC | PRN

## 2020-04-06 MED ORDER — PROPOFOL 1000 MG/100ML IV EMUL
INTRAVENOUS | Status: AC
  Filled 2020-04-06: qty 100

## 2020-04-06 MED ORDER — MEPERIDINE HCL 50 MG/ML IJ SOLN
6.2500 mg | INTRAMUSCULAR | Status: DC | PRN
Start: 2020-04-06 — End: 2020-04-06

## 2020-04-06 MED ORDER — POLYETHYLENE GLYCOL 3350 17 G PO PACK: 17.0000 g | PACK | Freq: Every day | ORAL | Status: DC | PRN

## 2020-04-06 MED ORDER — METHOCARBAMOL 500 MG PO TABS
500.0000 mg | ORAL_TABLET | Freq: Four times a day (QID) | ORAL | Status: DC | PRN
  Administered 2020-04-06 (×2): 500 mg via ORAL
  Filled 2020-04-06 (×2): qty 1

## 2020-04-06 MED ORDER — KETOROLAC TROMETHAMINE 30 MG/ML IJ SOLN
INTRAMUSCULAR | Status: AC
  Filled 2020-04-06: qty 1

## 2020-04-06 MED ORDER — LEVOTHYROXINE SODIUM 75 MCG PO TABS
75.0000 ug | ORAL_TABLET | ORAL | Status: DC
  Administered 2020-04-07: 75 ug via ORAL
  Filled 2020-04-06: qty 1

## 2020-04-06 MED ORDER — DEXAMETHASONE SODIUM PHOSPHATE 10 MG/ML IJ SOLN
10.0000 mg | Freq: Once | INTRAMUSCULAR | Status: AC
  Administered 2020-04-06: 10 mg via INTRAVENOUS

## 2020-04-06 MED ORDER — MORPHINE SULFATE (PF) 2 MG/ML IV SOLN
0.5000 mg | INTRAVENOUS | Status: DC | PRN
  Administered 2020-04-06: 1 mg via INTRAVENOUS
  Filled 2020-04-06 (×2): qty 1

## 2020-04-06 MED ORDER — POVIDONE-IODINE 10 % EX SWAB
2.0000 "application " | Freq: Once | CUTANEOUS | Status: AC
  Administered 2020-04-06: 2 via TOPICAL

## 2020-04-06 MED ORDER — 0.9 % SODIUM CHLORIDE (POUR BTL) OPTIME
TOPICAL | Status: DC | PRN
  Administered 2020-04-06: 1000 mL

## 2020-04-06 MED ORDER — CEFAZOLIN SODIUM-DEXTROSE 2-4 GM/100ML-% IV SOLN
2.0000 g | Freq: Four times a day (QID) | INTRAVENOUS | Status: AC
  Administered 2020-04-06 (×2): 2 g via INTRAVENOUS
  Filled 2020-04-06 (×3): qty 100

## 2020-04-06 MED ORDER — METOCLOPRAMIDE HCL 5 MG PO TABS: 5.0000 mg | ORAL_TABLET | Freq: Three times a day (TID) | ORAL | Status: DC | PRN

## 2020-04-06 MED ORDER — ACETAMINOPHEN 325 MG PO TABS: 325.0000 mg | ORAL_TABLET | Freq: Four times a day (QID) | ORAL | Status: DC | PRN

## 2020-04-06 MED ORDER — AMLODIPINE BESYLATE 5 MG PO TABS
2.5000 mg | ORAL_TABLET | Freq: Every day | ORAL | Status: DC
  Administered 2020-04-07: 2.5 mg via ORAL
  Filled 2020-04-06: qty 1

## 2020-04-06 MED ORDER — ONDANSETRON HCL 4 MG/2ML IJ SOLN
INTRAMUSCULAR | Status: DC | PRN
  Administered 2020-04-06: 4 mg via INTRAVENOUS

## 2020-04-06 MED ORDER — TRANEXAMIC ACID-NACL 1000-0.7 MG/100ML-% IV SOLN
1000.0000 mg | Freq: Once | INTRAVENOUS | Status: AC
  Administered 2020-04-06: 1000 mg via INTRAVENOUS
  Filled 2020-04-06: qty 100

## 2020-04-06 MED ORDER — DEXAMETHASONE SODIUM PHOSPHATE 10 MG/ML IJ SOLN
INTRAMUSCULAR | Status: AC
  Filled 2020-04-06: qty 1

## 2020-04-06 MED ORDER — HYDROCODONE-ACETAMINOPHEN 5-325 MG PO TABS: 1.0000 | ORAL_TABLET | ORAL | Status: DC | PRN

## 2020-04-06 MED ORDER — OXYCODONE HCL 5 MG PO TABS: 5.0000 mg | ORAL_TABLET | Freq: Once | ORAL | Status: DC | PRN

## 2020-04-06 MED ORDER — DOCUSATE SODIUM 100 MG PO CAPS
100.0000 mg | ORAL_CAPSULE | Freq: Two times a day (BID) | ORAL | Status: DC
  Administered 2020-04-06 – 2020-04-07 (×3): 100 mg via ORAL
  Filled 2020-04-06 (×3): qty 1

## 2020-04-06 MED ORDER — BUPIVACAINE IN DEXTROSE 0.75-8.25 % IT SOLN
INTRATHECAL | Status: DC | PRN
  Administered 2020-04-06: 1.6 mL via INTRATHECAL

## 2020-04-06 SURGICAL SUPPLY — 55 items
ATTUNE MED ANAT PAT 35 KNEE (Knees) ×2 IMPLANT
ATTUNE PSFEM LTSZ4 NARCEM KNEE (Femur) ×2 IMPLANT
ATTUNE PSRP INSR SZ4 7 KNEE (Insert) ×2 IMPLANT
BAG ZIPLOCK 12X15 (MISCELLANEOUS) IMPLANT
BASEPLATE TIBIAL ROTATING SZ 4 (Knees) ×2 IMPLANT
BLADE SAW SGTL 11.0X1.19X90.0M (BLADE) IMPLANT
BLADE SAW SGTL 13.0X1.19X90.0M (BLADE) ×2 IMPLANT
BLADE SURG SZ10 CARB STEEL (BLADE) ×4 IMPLANT
BNDG ELASTIC 6X5.8 VLCR STR LF (GAUZE/BANDAGES/DRESSINGS) ×2 IMPLANT
BOWL SMART MIX CTS (DISPOSABLE) ×2 IMPLANT
CEMENT HV SMART SET (Cement) ×4 IMPLANT
COVER WAND RF STERILE (DRAPES) IMPLANT
CUFF TOURN SGL QUICK 34 (TOURNIQUET CUFF) ×2
CUFF TRNQT CYL 34X4.125X (TOURNIQUET CUFF) ×1 IMPLANT
DECANTER SPIKE VIAL GLASS SM (MISCELLANEOUS) ×4 IMPLANT
DERMABOND ADVANCED (GAUZE/BANDAGES/DRESSINGS) ×1
DERMABOND ADVANCED .7 DNX12 (GAUZE/BANDAGES/DRESSINGS) ×1 IMPLANT
DRAPE U-SHAPE 47X51 STRL (DRAPES) ×2 IMPLANT
DRESSING AQUACEL AG SP 3.5X10 (GAUZE/BANDAGES/DRESSINGS) ×1 IMPLANT
DRSG AQUACEL AG ADV 3.5X10 (GAUZE/BANDAGES/DRESSINGS) ×2 IMPLANT
DRSG AQUACEL AG SP 3.5X10 (GAUZE/BANDAGES/DRESSINGS) ×2
DURAPREP 26ML APPLICATOR (WOUND CARE) ×4 IMPLANT
ELECT REM PT RETURN 15FT ADLT (MISCELLANEOUS) ×2 IMPLANT
GLOVE ORTHO TXT STRL SZ7.5 (GLOVE) ×2 IMPLANT
GLOVE SURG ENC MOIS LTX SZ6 (GLOVE) ×2 IMPLANT
GLOVE SURG LTX SZ8 (GLOVE) ×2 IMPLANT
GLOVE SURG UNDER POLY LF SZ6.5 (GLOVE) ×2 IMPLANT
GLOVE SURG UNDER POLY LF SZ7.5 (GLOVE) ×2 IMPLANT
GOWN STRL REUS W/TWL LRG LVL3 (GOWN DISPOSABLE) ×2 IMPLANT
HANDPIECE INTERPULSE COAX TIP (DISPOSABLE) ×2
HOLDER FOLEY CATH W/STRAP (MISCELLANEOUS) IMPLANT
IMMOBILIZER KNEE 20 (SOFTGOODS) ×2
IMMOBILIZER KNEE 20 THIGH 36 (SOFTGOODS) ×1 IMPLANT
KIT TURNOVER KIT A (KITS) ×2 IMPLANT
MANIFOLD NEPTUNE II (INSTRUMENTS) ×2 IMPLANT
NDL SAFETY ECLIPSE 18X1.5 (NEEDLE) ×1 IMPLANT
NEEDLE HYPO 18GX1.5 SHARP (NEEDLE) ×2
NS IRRIG 1000ML POUR BTL (IV SOLUTION) ×2 IMPLANT
PACK TOTAL KNEE CUSTOM (KITS) ×2 IMPLANT
PENCIL SMOKE EVACUATOR (MISCELLANEOUS) ×2 IMPLANT
PIN DRILL FIX HALF THREAD (BIT) ×2 IMPLANT
PIN FIX SIGMA LCS THRD HI (PIN) ×2 IMPLANT
PROTECTOR NERVE ULNAR (MISCELLANEOUS) ×2 IMPLANT
SET HNDPC FAN SPRY TIP SCT (DISPOSABLE) ×1 IMPLANT
SET PAD KNEE POSITIONER (MISCELLANEOUS) ×2 IMPLANT
SUT MNCRL AB 4-0 PS2 18 (SUTURE) ×2 IMPLANT
SUT STRATAFIX PDS+ 0 24IN (SUTURE) ×2 IMPLANT
SUT VIC AB 1 CT1 36 (SUTURE) ×2 IMPLANT
SUT VIC AB 2-0 CT1 27 (SUTURE) ×6
SUT VIC AB 2-0 CT1 TAPERPNT 27 (SUTURE) ×3 IMPLANT
SYR 3ML LL SCALE MARK (SYRINGE) ×4 IMPLANT
TRAY FOLEY MTR SLVR 14FR STAT (SET/KITS/TRAYS/PACK) IMPLANT
TUBE SUCTION HIGH CAP CLEAR NV (SUCTIONS) ×2 IMPLANT
WATER STERILE IRR 1000ML POUR (IV SOLUTION) ×4 IMPLANT
WRAP KNEE MAXI GEL POST OP (GAUZE/BANDAGES/DRESSINGS) ×2 IMPLANT

## 2020-04-06 NOTE — Transfer of Care (Signed)
Immediate Anesthesia Transfer of Care Note  Patient: [name]  Procedure(s) Performed: TOTAL KNEE ARTHROPLASTY (Left Knee)  Patient Location: PACU  Anesthesia Type:Spinal  Level of Consciousness: awake, alert  and oriented  Airway & Oxygen Therapy: Patient Spontanous Breathing and Patient connected to face mask oxygen  Post-op Assessment: Report given to RN and Post -op Vital signs reviewed and stable  Post vital signs: Reviewed and stable  Last Vitals:  Vitals Value Taken Time  BP 134/76 04/06/20 0911  Temp    Pulse 71 04/06/20 0912  Resp 10 04/06/20 0912  SpO2 100 % 04/06/20 0912  Vitals shown include unvalidated device data.  Last Pain:  Vitals:   04/06/20 0601  TempSrc:   PainSc: 3          Complications: No complications documented.

## 2020-04-06 NOTE — Op Note (Signed)
NAME:  [name]                      MEDICAL RECORD NO.:  [id number]                             FACILITY:  [facility]      PHYSICIAN:  Pietro Cassis. Alvan Dame, M.D.  DATE OF BIRTH:  [birth year]      DATE OF PROCEDURE:  04/06/2020                                     OPERATIVE REPORT         PREOPERATIVE DIAGNOSIS:  Left knee osteoarthritis.      POSTOPERATIVE DIAGNOSIS:  Left knee osteoarthritis.      FINDINGS:  The patient was noted to have complete loss of cartilage and   bone-on-bone arthritis with associated osteophytes in the medial and pateloofemoral compartments of   the knee with a significant synovitis and associated effusion.  The patient had failed months of conservative treatment including medications, injection therapy, activity modification.     PROCEDURE:  Left total knee replacement.      COMPONENTS USED:  DePuy Attune rotating platform posterior stabilized knee   system, a size 4N femur, 4 tibia, size 7 mm PS AOX insert, and 35 anatomic patellar   button.      SURGEON:  Pietro Cassis. Alvan Dame, M.D.      ASSISTANT:  Griffith Citron, PA-C.      ANESTHESIA:  Regional and Spinal.      SPECIMENS:  None.      COMPLICATION:  None.      DRAINS:  None.  EBL: <100 cc      TOURNIQUET TIME:   Total Tourniquet Time Documented: Thigh (Left) - 29 minutes Total: Thigh (Left) - 29 minutes      The patient was stable to the recovery room.      INDICATION FOR PROCEDURE:  [name] is a 70 y.o. female patient of   mine.  The patient had been seen, evaluated, and treated for months conservatively in the   office with medication, activity modification, and injections.  The patient had   radiographic changes of bone-on-bone arthritis with endplate sclerosis and osteophytes noted.  Based on the radiographic changes and failed conservative measures, the patient   decided to proceed with definitive treatment, total knee replacement.  Risks of infection, DVT, component failure, need  for revision surgery, neurovascular injury were reviewed in the office setting.  The postop course was reviewed stressing the efforts to maximize post-operative satisfaction and function.  Consent was obtained for benefit of pain   relief.      PROCEDURE IN DETAIL:  The patient was brought to the operative theater.   Once adequate anesthesia, preoperative antibiotics, 2 gm of Ancef,1 gm of Tranexamic Acid, and 10 mg of Decadron administered, the patient was positioned supine with a left thigh tourniquet placed.  The  left lower extremity was prepped and draped in sterile fashion.  A time-   out was performed identifying the patient, planned procedure, and the appropriate extremity.      The left lower extremity was placed in the [facility] leg holder.  The leg was   exsanguinated, tourniquet elevated to 250 mmHg.  A midline incision was   made followed  by median parapatellar arthrotomy.  Following initial   exposure, attention was first directed to the patella.  Precut   measurement was noted to be 24 mm.  I resected down to 14 mm and used a   35 anatomic patellar button to restore patellar height as well as cover the cut surface.      The lug holes were drilled and a metal shim was placed to protect the   patella from retractors and saw blade during the procedure.      At this point, attention was now directed to the femur.  The femoral   canal was opened with a drill, irrigated to try to prevent fat emboli.  An   intramedullary rod was passed at 3 degrees valgus, 9 mm of bone was   resected off the distal femur.  Following this resection, the tibia was   subluxated anteriorly.  Using the extramedullary guide, 2 mm of bone was resected off   the proximal medial tibia.  We confirmed the gap would be   stable medially and laterally with a size 5 spacer block as well as confirmed that the tibial cut was perpendicular in the coronal plane, checking with an alignment rod.      Once this was done, I  sized the femur to be a size 4 in the anterior-   posterior dimension, chose a narrow component based on medial and   lateral dimension.  The size 4 rotation block was then pinned in   position anterior referenced using the C-clamp to set rotation.  The   anterior, posterior, and  chamfer cuts were made without difficulty nor   notching making certain that I was along the anterior cortex to help   with flexion gap stability.      The final box cut was made off the lateral aspect of distal femur.      At this point, the tibia was sized to be a size 4.  The size 4 tray was   then pinned in position through the medial third of the tubercle,   drilled, and keel punched.  Trial reduction was now carried with a 4 femur,  4 tibia, a size 7 mm PS insert, and the 35 anatomic patella botton.  The knee was brought to full extension with good flexion stability with the patella   tracking through the trochlea without application of pressure.  Given   all these findings the trial components removed.  Final components were   opened and cement was mixed.  The knee was irrigated with normal saline solution and pulse lavage.  The synovial lining was   then injected with 30 cc of 0.25% Marcaine with epinephrine, 1 cc of Toradol and 30 cc of NS for a total of 61 cc.     Final implants were then cemented onto cleaned and dried cut surfaces of bone with the knee brought to extension with a size 7 mm PS trial insert.      Once the cement had fully cured, excess cement was removed   throughout the knee.  I confirmed that I was satisfied with the range of   motion and stability, and the final size 7 mm PS AOX insert was chosen.  It was   placed into the knee.      The tourniquet had been let down at 29 minutes.  No significant   hemostasis was required.  The extensor mechanism was then reapproximated using #1 Vicryl and #1  Stratafix sutures with the knee   in flexion.  The   remaining wound was closed with 2-0  Vicryl and running 4-0 Monocryl.   The knee was cleaned, dried, dressed sterilely using Dermabond and   Aquacel dressing.  The patient was then   brought to recovery room in stable condition, tolerating the procedure   well.   Please note that Physician Assistant, Griffith Citron, PA-C was present for the entirety of the case, and was utilized for pre-operative positioning, peri-operative retractor management, general facilitation of the procedure and for primary wound closure at the end of the case.              Pietro Cassis Alvan Dame, M.D.    04/06/2020 8:38 AM

## 2020-04-06 NOTE — Evaluation (Signed)
Physical Therapy Evaluation Patient Details Name: [name] MRN: [id number] DOB: December 12, 1950 Today's Date: 04/06/2020   History of Present Illness  Patient is 70 y.o. female s/p Lt TKA on 04/06/20 [place] significant for thyroid cancer, HTN. HLD, hypothyroidism, GERD, OA.  Clinical Impression  Pt is a 70y.o. female s/p Lt TKA POD 0. Pt reports that he is independent with mobility at baseline. Pt required MIN guard with cues for safe hand placement for sit to stand transfer. Pt required MIN assist progressing to MIN guard for safety with ambulation 79ft with verbal cues for RW management and step to gait pattern with no LOB. PT reviewed therapeutic intervention for promotion of DVT prevention, pt demonstrated understanding. Pt will have assistance from her husband and son upon discharge. Pt will benefit from skilled PT to increase independence and safety with mobility. Acute therapy to follow up during stay to progress functional mobility as able to ensure safe discharge home.       Follow Up Recommendations Outpatient PT;Follow surgeon's recommendation for DC plan and follow-up therapies    Equipment Recommendations  Rolling walker with 5" wheels;3in1 (PT)    Recommendations for Other Services       Precautions / Restrictions Precautions Precautions: Fall Restrictions Weight Bearing Restrictions: No Other Position/Activity Restrictions: WBAT      Mobility  Bed Mobility Overal bed mobility: Needs Assistance Bed Mobility: Supine to Sit     Supine to sit: Supervision;HOB elevated     General bed mobility comments: pt with use of B UEs to scoot to EOB with HOB elevated and supervision for safety    Transfers Overall transfer level: Needs assistance Equipment used: Rolling walker (2 wheeled) Transfers: Sit to/from Stand Sit to Stand: Min guard         General transfer comment: MIN guard for safety with cues for safe hand placement and review of WBAT  status  Ambulation/Gait Ambulation/Gait assistance: Min assist;Min guard Gait Distance (Feet): 35 Feet Assistive device: Rolling walker (2 wheeled) Gait Pattern/deviations: Step-to pattern;Decreased stride length;Decreased weight shift to left     General Gait Details: Pt performed pre-gait marching with use of B UEs on RW with no knee buckling. MIN assist progressing to MIN guard with cues for step to gait pattern and to maintain safe proximity to RW with no LOB.  Stairs            Wheelchair Mobility    Modified Rankin (Stroke Patients Only)       Balance Overall balance assessment: Needs assistance Sitting-balance support: Feet supported Sitting balance-Leahy Scale: Good     Standing balance support: Bilateral upper extremity supported;During functional activity Standing balance-Leahy Scale: Poor Standing balance comment: use of RW for standing balance                             Pertinent Vitals/Pain Pain Assessment: 0-10 Pain Score: 8  Pain Location: Lt knee Pain Descriptors / Indicators: Sore;Discomfort Pain Intervention(s): Limited activity within patient's tolerance;Monitored during session;Repositioned;Ice applied    Home Living Family/patient expects to be discharged to:: Private residence Living Arrangements: Spouse/significant other Available Help at Discharge: Family Type of Home: House Home Access: Stairs to enter Entrance Stairs-Rails: Left Entrance Stairs-Number of Steps: 3 Home Layout: One level Home Equipment: Shower seat - built in Additional Comments: pt will have assist from her husband and son at home    Prior Function Level of Independence: Independent  Hand Dominance   Dominant Hand: Right    Extremity/Trunk Assessment   Upper Extremity Assessment Upper Extremity Assessment: Overall WFL for tasks assessed    Lower Extremity Assessment Lower Extremity Assessment: LLE deficits/detail LLE  Deficits / Details: pt with B 4/5 dorsi/plantar flexion strength and able to complete full SLR with no extensor lag noted. LLE Sensation: WNL LLE Coordination: WNL    Cervical / Trunk Assessment Cervical / Trunk Assessment: Normal  Communication   Communication: No difficulties  Cognition Arousal/Alertness: Awake/alert Behavior During Therapy: WFL for tasks assessed/performed Overall Cognitive Status: Within Functional Limits for tasks assessed                                        General Comments      Exercises Total Joint Exercises Ankle Circles/Pumps: AROM;Both;20 reps;Seated   Assessment/Plan    PT Assessment Patient needs continued PT services  PT Problem List Decreased strength;Decreased range of motion;Decreased activity tolerance;Decreased balance;Decreased mobility;Decreased knowledge of use of DME;Pain       PT Treatment Interventions DME instruction;Gait training;Stair training;Functional mobility training;Therapeutic activities;Therapeutic exercise;Balance training;Patient/family education    PT Goals (Current goals can be found in the Care Plan section)  Acute Rehab PT Goals Patient Stated Goal: get back to taking walks PT Goal Formulation: With patient/family Time For Goal Achievement: 04/13/20 Potential to Achieve Goals: Good    Frequency 7X/week   Barriers to discharge        Co-evaluation               AM-PAC PT "6 Clicks" Mobility  Outcome Measure Help needed turning from your back to your side while in a flat bed without using bedrails?: None Help needed moving from lying on your back to sitting on the side of a flat bed without using bedrails?: None Help needed moving to and from a bed to a chair (including a wheelchair)?: A Little Help needed standing up from a chair using your arms (e.g., wheelchair or bedside chair)?: A Little Help needed to walk in hospital room?: A Little Help needed climbing 3-5 steps with a  railing? : A Little 6 Click Score: 20    End of Session Equipment Utilized During Treatment: Gait belt Activity Tolerance: Patient tolerated treatment well Patient left: in chair;with call bell/phone within reach;with chair alarm set;with family/visitor present Nurse Communication: Mobility status PT Visit Diagnosis: Unsteadiness on feet (R26.81);Muscle weakness (generalized) (M62.81);Pain Pain - Right/Left: Left Pain - part of body: Knee    Time: 1505-6979 PT Time Calculation (min) (ACUTE ONLY): 23 min   Charges:              Elna Breslow, SPT  Acute rehab   Elna Breslow 04/06/2020, 4:55 PM

## 2020-04-06 NOTE — Anesthesia Procedure Notes (Addendum)
Spinal  Patient location during procedure: OR End time: 04/06/2020 7:21 AM Reason for block: surgical anesthesia Staffing Performed: resident/CRNA  Anesthesiologist: Janeece Riggers, MD Resident/CRNA: Maxwell Caul, CRNA Preanesthetic Checklist Completed: patient identified, IV checked, site marked, risks and benefits discussed, surgical consent, monitors and equipment checked, pre-op evaluation and timeout performed Spinal Block Patient position: sitting Prep: DuraPrep Patient monitoring: heart rate, cardiac monitor, continuous pulse ox and blood pressure Approach: midline Location: L3-4 Injection technique: single-shot Needle Needle type: Pencan  Needle gauge: 24 G Needle length: 10 cm Assessment Sensory level: T4 Events: CSF return Additional Notes IV functioning, monitors applied to pt. Expiration date of kit checked and confirmed to be in date. Sterile prep and drape, hand hygiene and sterile gloved used. Pt was positioned and spine was prepped in sterile fashion. Skin was anesthetized with lidocaine. Free flow of clear CSF obtained prior to injecting local anesthetic into CSF x 1 attempt. Spinal needle aspirated freely following injection. Needle was carefully withdrawn, and pt tolerated procedure well. Loss of motor and sensory on exam post injection. Dr Ambrose Pancoast at bedside during entire placement.

## 2020-04-06 NOTE — Anesthesia Procedure Notes (Signed)
Procedure Name: MAC Date/Time: 04/06/2020 7:16 AM Performed by: Maxwell Caul, CRNA Pre-anesthesia Checklist: Patient identified, Emergency Drugs available, Suction available and Patient being monitored Oxygen Delivery Method: Simple face mask

## 2020-04-06 NOTE — Discharge Instructions (Signed)

## 2020-04-06 NOTE — Interval H&P Note (Signed)
History and Physical Interval Note:  04/06/2020 7:10 AM  [name]  has presented today for surgery, with the diagnosis of Left knee osteoarthritis.  The various methods of treatment have been discussed with the patient and family. After consideration of risks, benefits and other options for treatment, the patient has consented to  Procedure(s) with comments: TOTAL KNEE ARTHROPLASTY (Left) - 70 mins as a surgical intervention.  The patient's history has been reviewed, patient examined, no change in status, stable for surgery.  I have reviewed the patient's chart and labs.  Questions were answered to the patient's satisfaction.     Mauri Pole

## 2020-04-06 NOTE — Anesthesia Procedure Notes (Signed)
Anesthesia Regional Block: Adductor canal block   Pre-Anesthetic Checklist: ,, timeout performed, Correct Patient, Correct Site, Correct Laterality, Correct Procedure, Correct Position, site marked, Risks and benefits discussed,  Surgical consent,  Pre-op evaluation,  At surgeon's request and post-op pain management  Laterality: Left  Prep: chloraprep       Needles:  Injection technique: Single-shot  Needle Type: Echogenic Stimulator Needle     Needle Length: 5cm  Needle Gauge: 22     Additional Needles:   Procedures:, nerve stimulator,,, ultrasound used (permanent image in chart),,,,  Narrative:  Start time: 04/06/2020 7:00 AM End time: 04/06/2020 7:07 AM Injection made incrementally with aspirations every 5 mL.  Performed by: Personally  Anesthesiologist: Janeece Riggers, MD  Additional Notes: Functioning IV was confirmed and monitors were applied.  A 86mm 22ga Arrow echogenic stimulator needle was used. Sterile prep and drape,hand hygiene and sterile gloves were used. Ultrasound guidance: relevant anatomy identified, needle position confirmed, local anesthetic spread visualized around nerve(s)., vascular puncture avoided.  Image printed for medical record. Negative aspiration and negative test dose prior to incremental administration of local anesthetic. The patient tolerated the procedure well.

## 2020-04-07 ENCOUNTER — Encounter (HOSPITAL_COMMUNITY): Payer: Self-pay | Admitting: Orthopedic Surgery

## 2020-04-07 DIAGNOSIS — Z96652 Presence of left artificial knee joint: Secondary | ICD-10-CM | POA: Diagnosis not present

## 2020-04-07 DIAGNOSIS — E039 Hypothyroidism, unspecified: Secondary | ICD-10-CM | POA: Diagnosis not present

## 2020-04-07 DIAGNOSIS — Z85850 Personal history of malignant neoplasm of thyroid: Secondary | ICD-10-CM | POA: Diagnosis not present

## 2020-04-07 DIAGNOSIS — M1712 Unilateral primary osteoarthritis, left knee: Secondary | ICD-10-CM | POA: Diagnosis not present

## 2020-04-07 DIAGNOSIS — I10 Essential (primary) hypertension: Secondary | ICD-10-CM | POA: Diagnosis not present

## 2020-04-07 DIAGNOSIS — Z79899 Other long term (current) drug therapy: Secondary | ICD-10-CM | POA: Diagnosis not present

## 2020-04-07 LAB — BASIC METABOLIC PANEL
Anion gap: 10 (ref 5–15)
BUN: 25 mg/dL — ABNORMAL HIGH (ref 8–23)
CO2: 24 mmol/L (ref 22–32)
Calcium: 8.1 mg/dL — ABNORMAL LOW (ref 8.9–10.3)
Chloride: 104 mmol/L (ref 98–111)
Creatinine, Ser: 1.06 mg/dL — ABNORMAL HIGH (ref 0.44–1.00)
GFR, Estimated: 56 mL/min — ABNORMAL LOW (ref >60)
Glucose, Bld: 164 mg/dL — ABNORMAL HIGH (ref 70–99)
Potassium: 3.9 mmol/L (ref 3.5–5.1)
Sodium: 138 mmol/L (ref 135–145)

## 2020-04-07 LAB — CBC
HCT: 36.2 % (ref 36.0–46.0)
Hemoglobin: 12 g/dL (ref 12.0–15.0)
MCH: 30 pg (ref 26.0–34.0)
MCHC: 33.1 g/dL (ref 30.0–36.0)
MCV: 90.5 fL (ref 80.0–100.0)
Platelets: 277 10*3/uL (ref 150–400)
RBC: 4 MIL/uL (ref 3.87–5.11)
RDW: 13.7 % (ref 11.5–15.5)
WBC: 10.9 10*3/uL — ABNORMAL HIGH (ref 4.0–10.5)
nRBC: 0 % (ref 0.0–0.2)

## 2020-04-07 MED ORDER — ASPIRIN 81 MG PO CHEW: 81.0000 mg | CHEWABLE_TABLET | Freq: Two times a day (BID) | ORAL | 0 refills | Status: AC

## 2020-04-07 MED ORDER — POLYETHYLENE GLYCOL 3350 17 G PO PACK: 17.0000 g | PACK | Freq: Every day | ORAL | 0 refills | Status: AC | PRN

## 2020-04-07 MED ORDER — METHOCARBAMOL 500 MG PO TABS: 500.0000 mg | ORAL_TABLET | Freq: Four times a day (QID) | ORAL | 0 refills | Status: AC | PRN

## 2020-04-07 MED ORDER — HYDROCODONE-ACETAMINOPHEN 7.5-325 MG PO TABS: 1.0000 | ORAL_TABLET | Freq: Four times a day (QID) | ORAL | 0 refills | Status: AC | PRN

## 2020-04-07 NOTE — Progress Notes (Signed)
Physical Therapy Treatment Patient Details Name: [name] MRN: [id number] DOB: 1950/02/11 Today's Date: 04/07/2020    History of Present Illness Patient is 70 y.o. female s/p Lt TKA on 04/06/20 [place] significant for thyroid cancer, HTN. HLD, hypothyroidism, GERD, OA.    PT Comments    Pt ambulated in hallway and practiced safe stair technique with spouse assisting holding RW.  Pt provided with HEP handout.  Pt and spouse had no further questions, and pt feels ready for d/c home today.   Follow Up Recommendations  Outpatient PT;Follow surgeon's recommendation for DC plan and follow-up therapies     Equipment Recommendations  Rolling walker with 5" wheels;3in1 (PT)    Recommendations for Other Services       Precautions / Restrictions Precautions Precautions: Fall;Knee Restrictions Other Position/Activity Restrictions: WBAT    Mobility  Bed Mobility Overal bed mobility: Needs Assistance Bed Mobility: Supine to Sit     Supine to sit: Supervision;HOB elevated     General bed mobility comments: pt in recliner    Transfers Overall transfer level: Needs assistance Equipment used: Rolling walker (2 wheeled) Transfers: Sit to/from Stand Sit to Stand: Min guard         General transfer comment: verbal cues for UE and LE positioning  Ambulation/Gait Ambulation/Gait assistance: Min guard Gait Distance (Feet): 60 Feet Assistive device: Rolling walker (2 wheeled) Gait Pattern/deviations: Step-to pattern;Decreased stance time - left;Antalgic Gait velocity: decr   General Gait Details: verbla cues for sequence, RW positioning, step length   Stairs Stairs: Yes Stairs assistance: Min guard Stair Management: Step to pattern;Backwards;With walker Number of Stairs: 2 General stair comments: verbal cues for safe technique and sequencing; performed backwards with RW and then again with only left rail; spouse present and held RW for pt; both pt and spouse report  understanding   Wheelchair Mobility    Modified Rankin (Stroke Patients Only)       Balance                                            Cognition Arousal/Alertness: Awake/alert Behavior During Therapy: WFL for tasks assessed/performed Overall Cognitive Status: Within Functional Limits for tasks assessed                                        Exercises    General Comments        Pertinent Vitals/Pain Pain Assessment: 0-10 Pain Score: 7  Pain Location: Lt knee Pain Descriptors / Indicators: Sore;Discomfort;Aching Pain Intervention(s): Repositioned;Monitored during session    Home Living                      Prior Function            PT Goals (current goals can now be found in the care plan section) Progress towards PT goals: Progressing toward goals    Frequency    7X/week      PT Plan Current plan remains appropriate    Co-evaluation              AM-PAC PT "6 Clicks" Mobility   Outcome Measure  Help needed turning from your back to your side while in a flat bed without using bedrails?: None Help needed moving from lying on your  back to sitting on the side of a flat bed without using bedrails?: None Help needed moving to and from a bed to a chair (including a wheelchair)?: A Little Help needed standing up from a chair using your arms (e.g., wheelchair or bedside chair)?: A Little Help needed to walk in hospital room?: A Little Help needed climbing 3-5 steps with a railing? : A Little 6 Click Score: 20    End of Session Equipment Utilized During Treatment: Gait belt Activity Tolerance: Patient tolerated treatment well Patient left: in chair;with call bell/phone within reach;with family/visitor present Nurse Communication: Mobility status PT Visit Diagnosis: Muscle weakness (generalized) (M62.81);Difficulty in walking, not elsewhere classified (R26.2)     Time: 1470-9295 PT Time Calculation (min)  (ACUTE ONLY): 12 min  Charges:  $Gait Training: 8-22 mins                     Arlyce Dice, DPT Acute Rehabilitation Services Pager: [phone] Office: [phone]  York Ram E 04/07/2020, 2:55 PM

## 2020-04-07 NOTE — Anesthesia Postprocedure Evaluation (Signed)
Anesthesia Post Note  Patient: [name]  Procedure(s) Performed: TOTAL KNEE ARTHROPLASTY (Left Knee)     Patient location during evaluation: PACU Anesthesia Type: MAC Level of consciousness: oriented and awake and alert Pain management: pain level controlled Vital Signs Assessment: post-procedure vital signs reviewed and stable Respiratory status: spontaneous breathing, respiratory function stable and patient connected to nasal cannula oxygen Cardiovascular status: blood pressure returned to baseline and stable Postop Assessment: no headache, no backache and no apparent nausea or vomiting Anesthetic complications: no   No complications documented.  Last Vitals:  Vitals:   04/07/20 1007 04/07/20 1331  BP: (!) 152/63 (!) 157/80  Pulse: 79 78  Resp: 16   Temp: 36.8 C 36.7 C  SpO2: 100% 99%    Last Pain:  Vitals:   04/07/20 1007  TempSrc: Oral  PainSc:                  Lynnel Zanetti

## 2020-04-07 NOTE — Plan of Care (Signed)
Plan of care discussed with pt.

## 2020-04-07 NOTE — Progress Notes (Signed)
Physical Therapy Treatment Patient Details Name: [name] MRN: [id number] DOB: May 29, 1950 Today's Date: 04/07/2020    History of Present Illness Patient is 70 y.o. female s/p Lt TKA on 04/06/20 [place] significant for thyroid cancer, HTN. HLD, hypothyroidism, GERD, OA.    PT Comments    Pt assisted with ambulating in hallway, practiced safe stair technique, and performed a few LE exercises.  Pt plans to have another session and practice steps with spouse present and then d/c home today.    Follow Up Recommendations  Outpatient PT;Follow surgeon's recommendation for DC plan and follow-up therapies     Equipment Recommendations  Rolling walker with 5" wheels;3in1 (PT)    Recommendations for Other Services       Precautions / Restrictions Precautions Precautions: Fall;Knee Restrictions Other Position/Activity Restrictions: WBAT    Mobility  Bed Mobility Overal bed mobility: Needs Assistance Bed Mobility: Supine to Sit     Supine to sit: Supervision;HOB elevated          Transfers Overall transfer level: Needs assistance Equipment used: Rolling walker (2 wheeled) Transfers: Sit to/from Stand Sit to Stand: Min guard         General transfer comment: verbal cues for UE and LE positioning  Ambulation/Gait Ambulation/Gait assistance: Min guard Gait Distance (Feet): 140 Feet Assistive device: Rolling walker (2 wheeled) Gait Pattern/deviations: Step-to pattern;Decreased stance time - left;Antalgic     General Gait Details: verbla cues for sequence, RW positioning, step length   Stairs Stairs: Yes Stairs assistance: Min guard Stair Management: Step to pattern;Backwards;With walker Number of Stairs: 2 General stair comments: verbal cues for safe technique and sequencing; performed backwards with RW and then again with only left rail   Wheelchair Mobility    Modified Rankin (Stroke Patients Only)       Balance                                             Cognition Arousal/Alertness: Awake/alert Behavior During Therapy: WFL for tasks assessed/performed Overall Cognitive Status: Within Functional Limits for tasks assessed                                        Exercises Total Joint Exercises Ankle Circles/Pumps: AROM;Both;10 reps;15 reps Quad Sets: AROM;Both;10 reps Heel Slides: AAROM;Left;10 reps    General Comments        Pertinent Vitals/Pain Pain Assessment: 0-10 Pain Score: 4  Pain Location: Lt knee Pain Descriptors / Indicators: Sore;Discomfort;Aching Pain Intervention(s): Repositioned;Monitored during session    Home Living                      Prior Function            PT Goals (current goals can now be found in the care plan section) Progress towards PT goals: Progressing toward goals    Frequency    7X/week      PT Plan Current plan remains appropriate    Co-evaluation              AM-PAC PT "6 Clicks" Mobility   Outcome Measure  Help needed turning from your back to your side while in a flat bed without using bedrails?: None Help needed moving from lying on your back to sitting on  the side of a flat bed without using bedrails?: None Help needed moving to and from a bed to a chair (including a wheelchair)?: A Little Help needed standing up from a chair using your arms (e.g., wheelchair or bedside chair)?: A Little Help needed to walk in hospital room?: A Little Help needed climbing 3-5 steps with a railing? : A Little 6 Click Score: 20    End of Session Equipment Utilized During Treatment: Gait belt Activity Tolerance: Patient tolerated treatment well Patient left: in chair;with call bell/phone within reach;with chair alarm set Nurse Communication: Mobility status PT Visit Diagnosis: Muscle weakness (generalized) (M62.81);Difficulty in walking, not elsewhere classified (R26.2)     Time: 1219-7588 PT Time Calculation (min) (ACUTE ONLY):  28 min  Charges:  $Gait Training: 8-22 mins $Therapeutic Exercise: 8-22 mins                    Jannette Spanner PT, DPT Acute Rehabilitation Services Pager: [phone] Office: [phone]  Trena Platt 04/07/2020, 12:44 PM

## 2020-04-07 NOTE — TOC Transition Note (Signed)
Transition of Care [facility]) - CM/SW Discharge Note   Patient Details  Name: [name] MRN: [id number] Date of Birth: 01-Nov-1950  [facility] CM/SW Contact:  Lennart Pall, LCSW Phone Number: 04/07/2020, 10:11 AM   Clinical Narrative:    Met briefly with pt and confirming need for rw and 3n1 - orders placed with [facility].  Plan for OPPT at [facility].  No further TOC needs.   Final next level of care: OP Rehab Barriers to Discharge: No Barriers Identified   Patient Goals and CMS Choice Patient states their goals for this hospitalization and ongoing recovery are:: return home      Discharge Placement                       Discharge Plan and Services                DME Arranged: 3-N-1,Walker rolling DME Agency: [employer] Date DME Agency Contacted:  (orders sent prior to surgery)                Social Determinants of Health (SDOH) Interventions     Readmission Risk Interventions No flowsheet data found.

## 2020-04-07 NOTE — Progress Notes (Signed)
   Subjective: 1 Day Post-Op Procedure(s) (LRB): TOTAL KNEE ARTHROPLASTY (Left) Patient reports pain as mild.   Patient seen in rounds by Dr. Alvan Dame. Patient is well, and has had no acute complaints or problems other than discomfort in the knee. No acute events overnight. Foley catheter removed. Ambulated 35 feet with PT.  We will continue therapy today.   Objective: Vital signs in last 24 hours: Temp:  [97.4 F (36.3 C)-98.6 F (37 C)] 98.6 F (37 C) (03/30 0603) Pulse Rate:  [66-85] 85 (03/30 0603) Resp:  [9-18] 16 (03/30 0603) BP: (126-158)/(57-80) 129/57 (03/30 0603) SpO2:  [98 %-100 %] 100 % (03/30 0603)  Intake/Output from previous day:  Intake/Output Summary (Last 24 hours) at 04/07/2020 0731 Last data filed at 04/07/2020 0624 Gross per 24 hour  Intake 3392.5 ml  Output 1145 ml  Net 2247.5 ml     Intake/Output this shift: No intake/output data recorded.  Labs: Recent Labs    04/07/20 0305  HGB 12.0   Recent Labs    04/07/20 0305  WBC 10.9*  RBC 4.00  HCT 36.2  PLT 277   Recent Labs    04/07/20 0305  NA 138  K 3.9  CL 104  CO2 24  BUN 25*  CREATININE 1.06*  GLUCOSE 164*  CALCIUM 8.1*   No results for input(s): LABPT, INR in the last 72 hours.  Exam: General - Patient is Alert and Oriented Extremity - Neurologically intact Sensation intact distally Intact pulses distally Dorsiflexion/Plantar flexion intact Dressing - dressing C/D/I Motor Function - intact, moving foot and toes well on exam.   Past Medical History:  Diagnosis Date  . Arthritis   . Carpal tunnel syndrome   . GERD (gastroesophageal reflux disease)   . Headache    ocassionally  . Hyperlipidemia   . Hypertension   . Hypothyroidism   . Insomnia   . Meralgia paresthetica   . PONV (postoperative nausea and vomiting)   . Thyroid cancer, medullary carcinoma (HCC)     Assessment/Plan: 1 Day Post-Op Procedure(s) (LRB): TOTAL KNEE ARTHROPLASTY (Left) Active Problems:   S/P  total knee arthroplasty, left  Estimated body mass index is 25.15 kg/m as calculated from the following:   Height as of this encounter: 5\' 3"  (1.6 m).   Weight as of this encounter: 64.4 kg. Advance diet Up with therapy D/C IV fluids   Patient's anticipated LOS is less than 2 midnights, meeting these requirements: - Lives within 1 hour of care - Has a competent adult at home to recover with post-op recover - NO history of  - Chronic pain requiring opiods  - Diabetes  - Coronary Artery Disease  - Heart failure  - Heart attack  - Stroke  - DVT/VTE  - Cardiac arrhythmia  - Respiratory Failure/COPD  - Renal failure  - Anemia  - Advanced Liver disease  DVT Prophylaxis - Aspirin Weight bearing as tolerated.  Plan is to go Home after hospital stay. Plan for discharge today after 1-2 sessions of PT as long as she is meeting her goals. Patient is scheduled for OPPT. Follow up in the office in 2 weeks.   Griffith Citron, PA-C Orthopedic Surgery [phone] 04/07/2020, 7:31 AM

## 2020-04-09 DIAGNOSIS — M25562 Pain in left knee: Secondary | ICD-10-CM | POA: Diagnosis not present

## 2020-04-11 NOTE — Discharge Summary (Signed)
Physician Discharge Summary   Patient ID: [name] MRN: [id number] DOB/AGE: 10-08-1950 70 y.o.  Admit date: 04/06/2020 Discharge date: 04/07/2020  Primary Diagnosis: Left knee osteoarthritis  Admission Diagnoses:  Past Medical History:  Diagnosis Date  . Arthritis   . Carpal tunnel syndrome   . GERD (gastroesophageal reflux disease)   . Headache    ocassionally  . Hyperlipidemia   . Hypertension   . Hypothyroidism   . Insomnia   . Meralgia paresthetica   . PONV (postoperative nausea and vomiting)   . Thyroid cancer, medullary carcinoma [facility])    Discharge Diagnoses:   Active Problems:   S/P total knee arthroplasty, left  Estimated body mass index is 25.15 kg/m as calculated from the following:   Height as of this encounter: 5\' 3"  (1.6 m).   Weight as of this encounter: 64.4 kg.  Procedure:  Procedure(s) (LRB): TOTAL KNEE ARTHROPLASTY (Left)   Consults: None  HPI: [name] is a 70 y.o. female patient of   mine.  The patient had been seen, evaluated, and treated for months conservatively in the   office with medication, activity modification, and injections.  The patient had   radiographic changes of bone-on-bone arthritis with endplate sclerosis and osteophytes noted.  Based on the radiographic changes and failed conservative measures, the patient   decided to proceed with definitive treatment, total knee replacement.  Risks of infection, DVT, component failure, need for revision surgery, neurovascular injury were reviewed in the office setting.  The postop course was reviewed stressing the efforts to maximize post-operative satisfaction and function.  Consent was obtained for benefit of pain   relief.   Laboratory Data: Admission on 04/06/2020, Discharged on 04/07/2020  Component Date Value Ref Range Status  . WBC 04/07/2020 10.9* 4.0 - 10.5 K/uL Final  . RBC 04/07/2020 4.00  3.87 - 5.11 MIL/uL Final  . Hemoglobin 04/07/2020 12.0  12.0 - 15.0 g/dL Final   . HCT 04/07/2020 36.2  36.0 - 46.0 % Final  . MCV 04/07/2020 90.5  80.0 - 100.0 fL Final  . MCH 04/07/2020 30.0  26.0 - 34.0 pg Final  . MCHC 04/07/2020 33.1  30.0 - 36.0 g/dL Final  . RDW 04/07/2020 13.7  11.5 - 15.5 % Final  . Platelets 04/07/2020 277  150 - 400 K/uL Final  . nRBC 04/07/2020 0.0  0.0 - 0.2 % Final   Performed at [facility], [place] [address], [place], [place] [address]  . Sodium 04/07/2020 138  135 - 145 mmol/L Final  . Potassium 04/07/2020 3.9  3.5 - 5.1 mmol/L Final  . Chloride 04/07/2020 104  98 - 111 mmol/L Final  . CO2 04/07/2020 24  22 - 32 mmol/L Final  . Glucose, Bld 04/07/2020 164* 70 - 99 mg/dL Final   Glucose reference range applies only to samples taken after fasting for at least 8 hours.  . BUN 04/07/2020 25* 8 - 23 mg/dL Final  . Creatinine, Ser 04/07/2020 1.06* 0.44 - 1.00 mg/dL Final  . Calcium 04/07/2020 8.1* 8.9 - 10.3 mg/dL Final  . GFR, Estimated 04/07/2020 56* >60 mL/min Final   Comment: (NOTE) Calculated using the CKD-EPI Creatinine Equation (2021)   . Anion gap 04/07/2020 10  5 - 15 Final   Performed at [facility], [place] [address] [place], [place] [address]  Hospital Outpatient Visit on 04/02/2020  Component Date Value Ref Range Status  . SARS Coronavirus 2 04/02/2020 NEGATIVE  NEGATIVE Final  Comment: (NOTE) SARS-CoV-2 target nucleic acids are NOT DETECTED.  The SARS-CoV-2 RNA is generally detectable in upper and lower respiratory specimens during the acute phase of infection. Negative results do not preclude SARS-CoV-2 infection, do not rule out co-infections with other pathogens, and should not be used as the sole basis for treatment or other patient management decisions. Negative results must be combined with clinical observations, patient history, and epidemiological information. The expected result is Negative.  Fact Sheet for Patients: SugarRoll.be  Fact  Sheet for Healthcare Providers: https://www.woods-mathews.com/  This test is not yet approved or cleared by the [place] FDA and  has been authorized for detection and/or diagnosis of SARS-CoV-2 by FDA under an Emergency Use Authorization (EUA). This EUA will remain  in effect (meaning this test can be used) for the duration of the COVID-19 declaration under Se                          ction 564(b)(1) of the Act, 21 U.S.C. section 360bbb-3(b)(1), unless the authorization is terminated or revoked sooner.  Performed at [facility], [place] [address], [place], [place] [address]   Hospital Outpatient Visit on 03/29/2020  Component Date Value Ref Range Status  . MRSA, PCR 03/29/2020 NEGATIVE  NEGATIVE Final  . Staphylococcus aureus 03/29/2020 NEGATIVE  NEGATIVE Final   Comment: (NOTE) The Xpert SA Assay (FDA approved for NASAL specimens in patients 87 years of age and older), is one component of a comprehensive surveillance program. It is not intended to diagnose infection nor to guide or monitor treatment. Performed at [facility], [place] [address] [place], [place] [address]   . ABO/RH(D) 03/29/2020 B POS   Final  . Antibody Screen 03/29/2020 NEG   Final  . Sample Expiration 03/29/2020 04/09/2020,2359   Final  . Extend sample reason 03/29/2020    Final                   Value:NO TRANSFUSIONS OR PREGNANCY IN THE PAST 3 MONTHS Performed at [facility], [place] [address] [place], [place] [address]   . WBC 03/29/2020 6.6  4.0 - 10.5 K/uL Final  . RBC 03/29/2020 4.42  3.87 - 5.11 MIL/uL Final  . Hemoglobin 03/29/2020 13.0  12.0 - 15.0 g/dL Final  . HCT 03/29/2020 40.4  36.0 - 46.0 % Final  . MCV 03/29/2020 91.4  80.0 - 100.0 fL Final  . MCH 03/29/2020 29.4  26.0 - 34.0 pg Final  . MCHC 03/29/2020 32.2  30.0 - 36.0 g/dL Final  . RDW 03/29/2020 13.8  11.5 - 15.5 % Final  . Platelets 03/29/2020 261  150 - 400 K/uL Final  . nRBC  03/29/2020 0.0  0.0 - 0.2 % Final   Performed at [facility], [place] [address], [place], [place] [address]  . Sodium 03/29/2020 139  135 - 145 mmol/L Final  . Potassium 03/29/2020 4.1  3.5 - 5.1 mmol/L Final  . Chloride 03/29/2020 101  98 - 111 mmol/L Final  . CO2 03/29/2020 29  22 - 32 mmol/L Final  . Glucose, Bld 03/29/2020 108* 70 - 99 mg/dL Final   Glucose reference range applies only to samples taken after fasting for at least 8 hours.  . BUN 03/29/2020 15  8 - 23 mg/dL Final  . Creatinine, Ser 03/29/2020 1.03* 0.44 - 1.00 mg/dL Final  . Calcium 03/29/2020 9.0  8.9 - 10.3 mg/dL Final  .  GFR, Estimated 03/29/2020 58* >60 mL/min Final   Comment: (NOTE) Calculated using the CKD-EPI Creatinine Equation (2021)   . Anion gap 03/29/2020 9  5 - 15 Final   Performed at [facility], [place] [address], [place], [place] [address]     X-Rays:No results found.  EKG: Orders placed or performed during the hospital encounter of 03/29/20  . EKG 12 lead per protocol  . EKG 12 lead per protocol     Hospital Course: [name] is a 70 y.o. who was admitted to [facility]. They were brought to the operating room on 04/06/2020 and underwent Procedure(s): TOTAL KNEE ARTHROPLASTY.  Patient tolerated the procedure well and was later transferred to the recovery room and then to the orthopaedic floor for postoperative care. They were given PO and IV analgesics for pain control following their surgery. They were given 24 hours of postoperative antibiotics of  Anti-infectives (From admission, onward)   Start     Dose/Rate Route Frequency Ordered Stop   04/06/20 1330  ceFAZolin (ANCEF) IVPB 2g/100 mL premix        2 g 200 mL/hr over 30 Minutes Intravenous Every 6 hours 04/06/20 1109 04/06/20 2039   04/06/20 0600  ceFAZolin (ANCEF) IVPB 2g/100 mL premix        2 g 200 mL/hr over 30 Minutes Intravenous On call to O.R. 04/06/20 1914 04/06/20 0737     and  started on DVT prophylaxis in the form of Aspirin.   PT and OT were ordered for total joint protocol. Discharge planning consulted to help with postop disposition and equipment needs.  Patient had a good night on the evening of surgery. They started to get up OOB with therapy on POD #0. Pt was seen during rounds and was ready to go home pending progress with therapy.She worked with therapy on POD #1 and was meeting her goals. Pt was discharged to home later that day in stable condition.  Diet: Regular diet Activity: WBAT Follow-up: in 2 weeks Disposition: Home Discharged Condition: good   Discharge Instructions    Call MD / Call 911   Complete by: As directed    If you experience chest pain or shortness of breath, CALL 911 and be transported to the hospital emergency room.  If you develope a fever above 101 F, pus (white drainage) or increased drainage or redness at the wound, or calf pain, call your surgeon's office.   Change dressing   Complete by: As directed    Maintain surgical dressing until follow up in the clinic. If the edges start to pull up, may reinforce with tape. If the dressing is no longer working, may remove and cover with gauze and tape, but must keep the area dry and clean.  Call with any questions or concerns.   Constipation Prevention   Complete by: As directed    Drink plenty of fluids.  Prune juice may be helpful.  You may use a stool softener, such as Colace (over the counter) 100 mg twice a day.  Use MiraLax (over the counter) for constipation as needed.   Diet - low sodium heart healthy   Complete by: As directed    Discharge instructions   Complete by: As directed    Maintain surgical dressing until follow up in the clinic. If the edges start to pull up, may reinforce with tape. If the dressing is no longer working, may remove and cover with gauze and tape, but must keep  the area dry and clean.  Follow up in 2 weeks at [facility] Call with any questions or  concerns.   Increase activity slowly as tolerated   Complete by: As directed    Weight bearing as tolerated with assist device (walker, cane, etc) as directed, use it as long as suggested by your surgeon or therapist, typically at least 4-6 weeks.   TED hose   Complete by: As directed    Use stockings (TED hose) for 2 weeks on both leg(s).  You may remove them at night for sleeping.     Allergies as of 04/07/2020      Reactions   Penicillins Nausea And Vomiting   Has patient had a PCN reaction causing immediate rash, facial/tongue/throat swelling, SOB or lightheadedness with hypotension: no Has patient had a PCN reaction causing severe rash involving mucus membranes or skin necrosis: no Has patient had a PCN reaction that required hospitalization: no Has patient had a PCN reaction occurring within the last 10 years: no If all of the above answers are "NO", then may proceed with Cephalosporin use. Tolerated Cephalosporin Date: 04/06/20.      Medication List    TAKE these medications   amLODipine 5 MG tablet Commonly known as: NORVASC Take 2.5 mg by mouth daily.   aspirin 81 MG chewable tablet Chew 1 tablet (81 mg total) by mouth 2 (two) times daily for 28 days.   conjugated estrogens vaginal cream Commonly known as: PREMARIN Place 1 Applicatorful vaginally 2 (two) times a week.   cycloSPORINE 0.05 % ophthalmic emulsion Commonly known as: RESTASIS Place 1 drop into both eyes daily.   HYDROcodone-acetaminophen 7.5-325 MG tablet Commonly known as: NORCO Take 1-2 tablets by mouth every 6 (six) hours as needed for severe pain (pain score 7-10).   levothyroxine 75 MCG tablet Commonly known as: SYNTHROID Take 75 mcg by mouth every Monday, Tuesday, Wednesday, Thursday, and Friday. Takes 5 days a week Mon - Fri   levothyroxine 50 MCG tablet Commonly known as: SYNTHROID Take 50 mcg by mouth See admin instructions. Take 50 mcg by mouth on Saturday and Sunday    lisinopril-hydrochlorothiazide 20-12.5 MG tablet Commonly known as: ZESTORETIC Take 1 tablet by mouth daily.   methocarbamol 500 MG tablet Commonly known as: ROBAXIN Take 1 tablet (500 mg total) by mouth every 6 (six) hours as needed for muscle spasms.   metoprolol tartrate 25 MG tablet Commonly known as: LOPRESSOR Take 25 mg by mouth 2 (two) times daily.   polyethylene glycol 17 g packet Commonly known as: MIRALAX / GLYCOLAX Take 17 g by mouth daily as needed for mild constipation.   SOOTHE XP OP Place 1 drop into both eyes daily.   SYSTANE NIGHTTIME OP Place 1 drop into both eyes at bedtime.   SYSTANE OP Place 1 drop into both eyes in the morning and at bedtime.   valACYclovir 500 MG tablet Commonly known as: VALTREX Take 500 mg by mouth daily as needed (breakouts).            Discharge Care Instructions  (From admission, onward)         Start     Ordered   04/07/20 0000  Change dressing       Comments: Maintain surgical dressing until follow up in the clinic. If the edges start to pull up, may reinforce with tape. If the dressing is no longer working, may remove and cover with gauze and tape, but must keep the area dry and  clean.  Call with any questions or concerns.   04/07/20 0735          Follow-up Information    Paralee Cancel, MD. Schedule an appointment as soon as possible for a visit in 2 weeks.   Specialty: Orthopedic Surgery Contact information: [address] [place] [address] [phone]               Signed: Griffith Citron, PA-C Orthopedic Surgery 04/11/2020, 11:13 AM

## 2020-04-12 DIAGNOSIS — M25562 Pain in left knee: Secondary | ICD-10-CM | POA: Diagnosis not present

## 2020-04-14 DIAGNOSIS — M25562 Pain in left knee: Secondary | ICD-10-CM | POA: Diagnosis not present

## 2020-04-16 DIAGNOSIS — M25562 Pain in left knee: Secondary | ICD-10-CM | POA: Diagnosis not present

## 2020-04-19 DIAGNOSIS — M25562 Pain in left knee: Secondary | ICD-10-CM | POA: Diagnosis not present

## 2020-04-21 DIAGNOSIS — M25562 Pain in left knee: Secondary | ICD-10-CM | POA: Diagnosis not present

## 2020-04-23 DIAGNOSIS — M25562 Pain in left knee: Secondary | ICD-10-CM | POA: Diagnosis not present

## 2020-04-26 DIAGNOSIS — M25562 Pain in left knee: Secondary | ICD-10-CM | POA: Diagnosis not present

## 2020-04-29 DIAGNOSIS — M25562 Pain in left knee: Secondary | ICD-10-CM | POA: Diagnosis not present

## 2020-05-03 DIAGNOSIS — M25562 Pain in left knee: Secondary | ICD-10-CM | POA: Diagnosis not present

## 2020-05-06 DIAGNOSIS — M25562 Pain in left knee: Secondary | ICD-10-CM | POA: Diagnosis not present

## 2020-05-10 DIAGNOSIS — M25562 Pain in left knee: Secondary | ICD-10-CM | POA: Diagnosis not present

## 2020-05-12 DIAGNOSIS — M25562 Pain in left knee: Secondary | ICD-10-CM | POA: Diagnosis not present

## 2020-05-25 DIAGNOSIS — M25562 Pain in left knee: Secondary | ICD-10-CM | POA: Diagnosis not present

## 2020-05-26 DIAGNOSIS — Z471 Aftercare following joint replacement surgery: Secondary | ICD-10-CM | POA: Diagnosis not present

## 2020-05-26 DIAGNOSIS — Z96652 Presence of left artificial knee joint: Secondary | ICD-10-CM | POA: Diagnosis not present

## 2020-06-01 DIAGNOSIS — M25562 Pain in left knee: Secondary | ICD-10-CM | POA: Diagnosis not present

## 2020-06-04 DIAGNOSIS — M25562 Pain in left knee: Secondary | ICD-10-CM | POA: Diagnosis not present

## 2020-06-11 DIAGNOSIS — E785 Hyperlipidemia, unspecified: Secondary | ICD-10-CM | POA: Diagnosis not present

## 2020-06-11 DIAGNOSIS — M1990 Unspecified osteoarthritis, unspecified site: Secondary | ICD-10-CM | POA: Diagnosis not present

## 2020-06-11 DIAGNOSIS — E039 Hypothyroidism, unspecified: Secondary | ICD-10-CM | POA: Diagnosis not present

## 2020-06-11 DIAGNOSIS — M1712 Unilateral primary osteoarthritis, left knee: Secondary | ICD-10-CM | POA: Diagnosis not present

## 2020-06-11 DIAGNOSIS — M179 Osteoarthritis of knee, unspecified: Secondary | ICD-10-CM | POA: Diagnosis not present

## 2020-06-11 DIAGNOSIS — I10 Essential (primary) hypertension: Secondary | ICD-10-CM | POA: Diagnosis not present

## 2020-06-11 DIAGNOSIS — E890 Postprocedural hypothyroidism: Secondary | ICD-10-CM | POA: Diagnosis not present

## 2020-06-11 DIAGNOSIS — G4700 Insomnia, unspecified: Secondary | ICD-10-CM | POA: Diagnosis not present

## 2020-06-14 DIAGNOSIS — M25562 Pain in left knee: Secondary | ICD-10-CM | POA: Diagnosis not present

## 2020-06-17 DIAGNOSIS — M25562 Pain in left knee: Secondary | ICD-10-CM | POA: Diagnosis not present

## 2020-06-21 DIAGNOSIS — M25562 Pain in left knee: Secondary | ICD-10-CM | POA: Diagnosis not present

## 2020-06-24 DIAGNOSIS — M25562 Pain in left knee: Secondary | ICD-10-CM | POA: Diagnosis not present

## 2020-07-02 DIAGNOSIS — M25562 Pain in left knee: Secondary | ICD-10-CM | POA: Diagnosis not present

## 2020-07-06 DIAGNOSIS — M25562 Pain in left knee: Secondary | ICD-10-CM | POA: Diagnosis not present

## 2020-07-13 DIAGNOSIS — M25562 Pain in left knee: Secondary | ICD-10-CM | POA: Diagnosis not present

## 2020-07-16 ENCOUNTER — Ambulatory Visit: Payer: Medicare Other | Attending: Internal Medicine

## 2020-07-16 ENCOUNTER — Other Ambulatory Visit: Payer: Self-pay

## 2020-07-16 ENCOUNTER — Other Ambulatory Visit (HOSPITAL_BASED_OUTPATIENT_CLINIC_OR_DEPARTMENT_OTHER): Payer: Self-pay

## 2020-07-16 DIAGNOSIS — Z23 Encounter for immunization: Secondary | ICD-10-CM

## 2020-07-16 MED ORDER — PFIZER-BIONT COVID-19 VAC-TRIS 30 MCG/0.3ML IM SUSP
INTRAMUSCULAR | 0 refills | Status: AC
  Filled 2020-07-16: qty 0.3, 1d supply, fill #0

## 2020-07-21 DIAGNOSIS — M25562 Pain in left knee: Secondary | ICD-10-CM | POA: Diagnosis not present

## 2020-08-13 ENCOUNTER — Other Ambulatory Visit: Payer: Self-pay | Admitting: Internal Medicine

## 2020-08-13 ENCOUNTER — Other Ambulatory Visit: Payer: Self-pay | Admitting: Pediatric Surgery

## 2020-08-13 DIAGNOSIS — Z1231 Encounter for screening mammogram for malignant neoplasm of breast: Secondary | ICD-10-CM

## 2020-09-22 DIAGNOSIS — M25562 Pain in left knee: Secondary | ICD-10-CM | POA: Diagnosis not present

## 2020-10-04 ENCOUNTER — Other Ambulatory Visit: Payer: Self-pay

## 2020-10-04 ENCOUNTER — Ambulatory Visit
Admission: RE | Admit: 2020-10-04 | Discharge: 2020-10-04 | Disposition: A | Discharge status: Home / Self Care | Payer: Medicare Other | Source: Ambulatory Visit | Attending: Internal Medicine | Admitting: Internal Medicine

## 2020-10-04 DIAGNOSIS — Z1231 Encounter for screening mammogram for malignant neoplasm of breast: Secondary | ICD-10-CM

## 2020-11-11 DIAGNOSIS — Z23 Encounter for immunization: Secondary | ICD-10-CM | POA: Diagnosis not present

## 2021-02-03 DIAGNOSIS — E039 Hypothyroidism, unspecified: Secondary | ICD-10-CM | POA: Diagnosis not present

## 2021-02-03 DIAGNOSIS — Z85850 Personal history of malignant neoplasm of thyroid: Secondary | ICD-10-CM | POA: Diagnosis not present

## 2021-02-10 DIAGNOSIS — H11432 Conjunctival hyperemia, left eye: Secondary | ICD-10-CM | POA: Diagnosis not present

## 2021-02-10 DIAGNOSIS — H18832 Recurrent erosion of cornea, left eye: Secondary | ICD-10-CM | POA: Diagnosis not present

## 2021-02-11 DIAGNOSIS — H18832 Recurrent erosion of cornea, left eye: Secondary | ICD-10-CM | POA: Diagnosis not present

## 2021-02-16 DIAGNOSIS — H18832 Recurrent erosion of cornea, left eye: Secondary | ICD-10-CM | POA: Diagnosis not present

## 2021-02-28 DIAGNOSIS — E559 Vitamin D deficiency, unspecified: Secondary | ICD-10-CM | POA: Diagnosis not present

## 2021-02-28 DIAGNOSIS — M26609 Unspecified temporomandibular joint disorder, unspecified side: Secondary | ICD-10-CM | POA: Diagnosis not present

## 2021-02-28 DIAGNOSIS — H9202 Otalgia, left ear: Secondary | ICD-10-CM | POA: Diagnosis not present

## 2021-02-28 DIAGNOSIS — Z0000 Encounter for general adult medical examination without abnormal findings: Secondary | ICD-10-CM | POA: Diagnosis not present

## 2021-02-28 DIAGNOSIS — Z79899 Other long term (current) drug therapy: Secondary | ICD-10-CM | POA: Diagnosis not present

## 2021-02-28 DIAGNOSIS — I10 Essential (primary) hypertension: Secondary | ICD-10-CM | POA: Diagnosis not present

## 2021-02-28 DIAGNOSIS — M7581 Other shoulder lesions, right shoulder: Secondary | ICD-10-CM | POA: Diagnosis not present

## 2021-02-28 DIAGNOSIS — E785 Hyperlipidemia, unspecified: Secondary | ICD-10-CM | POA: Diagnosis not present

## 2021-03-04 DIAGNOSIS — M7581 Other shoulder lesions, right shoulder: Secondary | ICD-10-CM | POA: Diagnosis not present

## 2021-04-06 DIAGNOSIS — Z96652 Presence of left artificial knee joint: Secondary | ICD-10-CM | POA: Diagnosis not present

## 2021-05-26 DIAGNOSIS — R220 Localized swelling, mass and lump, head: Secondary | ICD-10-CM | POA: Diagnosis not present

## 2021-08-26 ENCOUNTER — Other Ambulatory Visit: Payer: Self-pay | Admitting: Internal Medicine

## 2021-08-26 DIAGNOSIS — Z1231 Encounter for screening mammogram for malignant neoplasm of breast: Secondary | ICD-10-CM

## 2021-10-07 ENCOUNTER — Ambulatory Visit: Payer: Medicare Other

## 2021-10-19 ENCOUNTER — Ambulatory Visit
Admission: RE | Admit: 2021-10-19 | Discharge: 2021-10-19 | Disposition: A | Discharge status: Home / Self Care | Payer: Medicare Other | Source: Ambulatory Visit | Attending: Internal Medicine | Admitting: Internal Medicine

## 2021-10-19 DIAGNOSIS — Z1231 Encounter for screening mammogram for malignant neoplasm of breast: Secondary | ICD-10-CM

## 2021-10-22 DIAGNOSIS — Z23 Encounter for immunization: Secondary | ICD-10-CM | POA: Diagnosis not present

## 2021-11-07 DIAGNOSIS — M25511 Pain in right shoulder: Secondary | ICD-10-CM | POA: Diagnosis not present

## 2021-11-15 DIAGNOSIS — M25562 Pain in left knee: Secondary | ICD-10-CM | POA: Diagnosis not present

## 2022-01-24 DIAGNOSIS — M25511 Pain in right shoulder: Secondary | ICD-10-CM | POA: Diagnosis not present

## 2022-01-24 DIAGNOSIS — M25571 Pain in right ankle and joints of right foot: Secondary | ICD-10-CM | POA: Diagnosis not present

## 2022-01-26 DIAGNOSIS — M25511 Pain in right shoulder: Secondary | ICD-10-CM | POA: Diagnosis not present

## 2022-01-30 DIAGNOSIS — M25511 Pain in right shoulder: Secondary | ICD-10-CM | POA: Diagnosis not present

## 2022-02-02 DIAGNOSIS — E039 Hypothyroidism, unspecified: Secondary | ICD-10-CM | POA: Diagnosis not present

## 2022-02-02 DIAGNOSIS — I10 Essential (primary) hypertension: Secondary | ICD-10-CM | POA: Diagnosis not present

## 2022-02-02 DIAGNOSIS — Z85850 Personal history of malignant neoplasm of thyroid: Secondary | ICD-10-CM | POA: Diagnosis not present

## 2022-02-03 DIAGNOSIS — Z9189 Other specified personal risk factors, not elsewhere classified: Secondary | ICD-10-CM | POA: Diagnosis not present

## 2022-02-06 DIAGNOSIS — M7541 Impingement syndrome of right shoulder: Secondary | ICD-10-CM | POA: Diagnosis not present

## 2022-02-13 DIAGNOSIS — M7541 Impingement syndrome of right shoulder: Secondary | ICD-10-CM | POA: Diagnosis not present

## 2022-02-17 DIAGNOSIS — I10 Essential (primary) hypertension: Secondary | ICD-10-CM | POA: Diagnosis not present

## 2022-02-17 DIAGNOSIS — Z85850 Personal history of malignant neoplasm of thyroid: Secondary | ICD-10-CM | POA: Diagnosis not present

## 2022-02-17 DIAGNOSIS — E039 Hypothyroidism, unspecified: Secondary | ICD-10-CM | POA: Diagnosis not present

## 2022-02-20 DIAGNOSIS — M7541 Impingement syndrome of right shoulder: Secondary | ICD-10-CM | POA: Diagnosis not present

## 2022-03-06 DIAGNOSIS — M7541 Impingement syndrome of right shoulder: Secondary | ICD-10-CM | POA: Diagnosis not present

## 2022-03-08 DIAGNOSIS — M7541 Impingement syndrome of right shoulder: Secondary | ICD-10-CM | POA: Diagnosis not present

## 2022-03-08 DIAGNOSIS — M25562 Pain in left knee: Secondary | ICD-10-CM | POA: Diagnosis not present

## 2022-03-09 DIAGNOSIS — I10 Essential (primary) hypertension: Secondary | ICD-10-CM | POA: Diagnosis not present

## 2022-03-09 DIAGNOSIS — N289 Disorder of kidney and ureter, unspecified: Secondary | ICD-10-CM | POA: Diagnosis not present

## 2022-03-13 DIAGNOSIS — M7541 Impingement syndrome of right shoulder: Secondary | ICD-10-CM | POA: Diagnosis not present

## 2022-03-20 DIAGNOSIS — N1831 Chronic kidney disease, stage 3a: Secondary | ICD-10-CM | POA: Diagnosis not present

## 2022-03-20 DIAGNOSIS — I10 Essential (primary) hypertension: Secondary | ICD-10-CM | POA: Diagnosis not present

## 2022-03-20 DIAGNOSIS — Z85850 Personal history of malignant neoplasm of thyroid: Secondary | ICD-10-CM | POA: Diagnosis not present

## 2022-03-20 DIAGNOSIS — H8110 Benign paroxysmal vertigo, unspecified ear: Secondary | ICD-10-CM | POA: Diagnosis not present

## 2022-03-20 DIAGNOSIS — R252 Cramp and spasm: Secondary | ICD-10-CM | POA: Diagnosis not present

## 2022-03-20 DIAGNOSIS — E039 Hypothyroidism, unspecified: Secondary | ICD-10-CM | POA: Diagnosis not present

## 2022-04-03 DIAGNOSIS — E890 Postprocedural hypothyroidism: Secondary | ICD-10-CM | POA: Diagnosis not present

## 2022-04-03 DIAGNOSIS — Z0000 Encounter for general adult medical examination without abnormal findings: Secondary | ICD-10-CM | POA: Diagnosis not present

## 2022-04-03 DIAGNOSIS — I10 Essential (primary) hypertension: Secondary | ICD-10-CM | POA: Diagnosis not present

## 2022-04-03 DIAGNOSIS — H8112 Benign paroxysmal vertigo, left ear: Secondary | ICD-10-CM | POA: Diagnosis not present

## 2022-04-03 DIAGNOSIS — R140 Abdominal distension (gaseous): Secondary | ICD-10-CM | POA: Diagnosis not present

## 2022-04-03 DIAGNOSIS — Z9181 History of falling: Secondary | ICD-10-CM | POA: Diagnosis not present

## 2022-04-03 DIAGNOSIS — N1831 Chronic kidney disease, stage 3a: Secondary | ICD-10-CM | POA: Diagnosis not present

## 2022-04-03 DIAGNOSIS — H60543 Acute eczematoid otitis externa, bilateral: Secondary | ICD-10-CM | POA: Diagnosis not present

## 2022-04-03 DIAGNOSIS — Z780 Asymptomatic menopausal state: Secondary | ICD-10-CM | POA: Diagnosis not present

## 2022-04-03 DIAGNOSIS — E559 Vitamin D deficiency, unspecified: Secondary | ICD-10-CM | POA: Diagnosis not present

## 2022-04-05 DIAGNOSIS — M545 Low back pain, unspecified: Secondary | ICD-10-CM | POA: Diagnosis not present

## 2022-04-18 DIAGNOSIS — I10 Essential (primary) hypertension: Secondary | ICD-10-CM | POA: Diagnosis not present

## 2022-04-18 DIAGNOSIS — E785 Hyperlipidemia, unspecified: Secondary | ICD-10-CM | POA: Diagnosis not present

## 2022-04-18 DIAGNOSIS — H8112 Benign paroxysmal vertigo, left ear: Secondary | ICD-10-CM | POA: Diagnosis not present

## 2022-04-18 DIAGNOSIS — Z9181 History of falling: Secondary | ICD-10-CM | POA: Diagnosis not present

## 2022-04-18 DIAGNOSIS — R140 Abdominal distension (gaseous): Secondary | ICD-10-CM | POA: Diagnosis not present

## 2022-04-18 DIAGNOSIS — E559 Vitamin D deficiency, unspecified: Secondary | ICD-10-CM | POA: Diagnosis not present

## 2022-04-19 ENCOUNTER — Other Ambulatory Visit: Payer: Self-pay | Admitting: Internal Medicine

## 2022-04-19 DIAGNOSIS — R1030 Lower abdominal pain, unspecified: Secondary | ICD-10-CM

## 2022-04-19 DIAGNOSIS — K8689 Other specified diseases of pancreas: Secondary | ICD-10-CM | POA: Diagnosis not present

## 2022-04-27 DIAGNOSIS — R252 Cramp and spasm: Secondary | ICD-10-CM | POA: Diagnosis not present

## 2022-04-27 DIAGNOSIS — R1030 Lower abdominal pain, unspecified: Secondary | ICD-10-CM | POA: Diagnosis not present

## 2022-04-27 DIAGNOSIS — Z9181 History of falling: Secondary | ICD-10-CM | POA: Diagnosis not present

## 2022-05-02 ENCOUNTER — Other Ambulatory Visit: Payer: Self-pay | Admitting: Internal Medicine

## 2022-05-02 DIAGNOSIS — R935 Abnormal findings on diagnostic imaging of other abdominal regions, including retroperitoneum: Secondary | ICD-10-CM

## 2022-05-02 DIAGNOSIS — K5909 Other constipation: Secondary | ICD-10-CM | POA: Diagnosis not present

## 2022-05-04 DIAGNOSIS — L649 Androgenic alopecia, unspecified: Secondary | ICD-10-CM | POA: Diagnosis not present

## 2022-05-08 DIAGNOSIS — K8689 Other specified diseases of pancreas: Secondary | ICD-10-CM | POA: Diagnosis not present

## 2022-05-08 DIAGNOSIS — K7689 Other specified diseases of liver: Secondary | ICD-10-CM | POA: Diagnosis not present

## 2022-05-08 DIAGNOSIS — R935 Abnormal findings on diagnostic imaging of other abdominal regions, including retroperitoneum: Secondary | ICD-10-CM | POA: Diagnosis not present

## 2022-05-08 DIAGNOSIS — K838 Other specified diseases of biliary tract: Secondary | ICD-10-CM | POA: Diagnosis not present

## 2022-05-08 DIAGNOSIS — K5900 Constipation, unspecified: Secondary | ICD-10-CM | POA: Diagnosis not present

## 2022-05-12 ENCOUNTER — Other Ambulatory Visit: Payer: Self-pay | Admitting: Gastroenterology

## 2022-05-20 ENCOUNTER — Other Ambulatory Visit: Payer: Medicare Other

## 2022-05-23 ENCOUNTER — Encounter (HOSPITAL_COMMUNITY): Admission: RE | Payer: Self-pay | Source: Home / Self Care

## 2022-05-23 ENCOUNTER — Ambulatory Visit (HOSPITAL_COMMUNITY): Admission: RE | Admit: 2022-05-23 | Payer: Medicare Other | Source: Home / Self Care | Admitting: Gastroenterology

## 2022-05-23 SURGERY — UPPER ESOPHAGEAL ENDOSCOPIC ULTRASOUND (EUS)
Anesthesia: Monitor Anesthesia Care | Laterality: Bilateral

## 2022-05-24 DIAGNOSIS — M545 Low back pain, unspecified: Secondary | ICD-10-CM | POA: Diagnosis not present

## 2022-06-08 DIAGNOSIS — K838 Other specified diseases of biliary tract: Secondary | ICD-10-CM | POA: Diagnosis not present

## 2022-06-08 DIAGNOSIS — K8689 Other specified diseases of pancreas: Secondary | ICD-10-CM | POA: Diagnosis not present

## 2022-06-08 DIAGNOSIS — R935 Abnormal findings on diagnostic imaging of other abdominal regions, including retroperitoneum: Secondary | ICD-10-CM | POA: Diagnosis not present

## 2022-06-27 DIAGNOSIS — K5909 Other constipation: Secondary | ICD-10-CM | POA: Diagnosis not present

## 2022-06-27 DIAGNOSIS — R935 Abnormal findings on diagnostic imaging of other abdominal regions, including retroperitoneum: Secondary | ICD-10-CM | POA: Diagnosis not present

## 2022-08-16 ENCOUNTER — Other Ambulatory Visit (HOSPITAL_COMMUNITY): Payer: Self-pay | Admitting: Internal Medicine

## 2022-08-16 DIAGNOSIS — H8112 Benign paroxysmal vertigo, left ear: Secondary | ICD-10-CM | POA: Diagnosis not present

## 2022-08-16 DIAGNOSIS — K5909 Other constipation: Secondary | ICD-10-CM | POA: Diagnosis not present

## 2022-08-16 DIAGNOSIS — I10 Essential (primary) hypertension: Secondary | ICD-10-CM | POA: Diagnosis not present

## 2022-08-16 DIAGNOSIS — E785 Hyperlipidemia, unspecified: Secondary | ICD-10-CM

## 2022-08-30 ENCOUNTER — Ambulatory Visit (HOSPITAL_COMMUNITY)
Admission: RE | Admit: 2022-08-30 | Discharge: 2022-08-30 | Disposition: A | Discharge status: Home / Self Care | Payer: Medicare Other | Source: Ambulatory Visit | Attending: Internal Medicine | Admitting: Internal Medicine

## 2022-08-30 DIAGNOSIS — E785 Hyperlipidemia, unspecified: Secondary | ICD-10-CM | POA: Insufficient documentation

## 2022-09-12 DIAGNOSIS — M5136 Other intervertebral disc degeneration, lumbar region: Secondary | ICD-10-CM | POA: Diagnosis not present

## 2022-09-12 DIAGNOSIS — M47816 Spondylosis without myelopathy or radiculopathy, lumbar region: Secondary | ICD-10-CM | POA: Diagnosis not present

## 2022-09-12 DIAGNOSIS — M48061 Spinal stenosis, lumbar region without neurogenic claudication: Secondary | ICD-10-CM | POA: Diagnosis not present

## 2022-09-12 DIAGNOSIS — M545 Low back pain, unspecified: Secondary | ICD-10-CM | POA: Diagnosis not present

## 2022-09-18 ENCOUNTER — Other Ambulatory Visit: Payer: Self-pay | Admitting: Internal Medicine

## 2022-09-18 DIAGNOSIS — Z1231 Encounter for screening mammogram for malignant neoplasm of breast: Secondary | ICD-10-CM

## 2022-09-20 DIAGNOSIS — K838 Other specified diseases of biliary tract: Secondary | ICD-10-CM | POA: Diagnosis not present

## 2022-09-20 DIAGNOSIS — K5909 Other constipation: Secondary | ICD-10-CM | POA: Diagnosis not present

## 2022-10-23 ENCOUNTER — Ambulatory Visit
Admission: RE | Admit: 2022-10-23 | Discharge: 2022-10-23 | Disposition: A | Discharge status: Home / Self Care | Payer: Medicare Other | Source: Ambulatory Visit | Attending: Internal Medicine | Admitting: Internal Medicine

## 2022-10-23 DIAGNOSIS — Z1231 Encounter for screening mammogram for malignant neoplasm of breast: Secondary | ICD-10-CM | POA: Diagnosis not present

## 2022-10-28 DIAGNOSIS — Z23 Encounter for immunization: Secondary | ICD-10-CM | POA: Diagnosis not present

## 2022-12-04 ENCOUNTER — Other Ambulatory Visit: Payer: Self-pay | Admitting: Physician Assistant

## 2022-12-04 DIAGNOSIS — T84033A Mechanical loosening of internal left knee prosthetic joint, initial encounter: Secondary | ICD-10-CM

## 2022-12-04 DIAGNOSIS — M7541 Impingement syndrome of right shoulder: Secondary | ICD-10-CM | POA: Diagnosis not present

## 2022-12-04 DIAGNOSIS — M25562 Pain in left knee: Secondary | ICD-10-CM | POA: Diagnosis not present

## 2022-12-05 DIAGNOSIS — L649 Androgenic alopecia, unspecified: Secondary | ICD-10-CM | POA: Diagnosis not present

## 2022-12-11 DIAGNOSIS — K8689 Other specified diseases of pancreas: Secondary | ICD-10-CM | POA: Diagnosis not present

## 2022-12-11 DIAGNOSIS — K838 Other specified diseases of biliary tract: Secondary | ICD-10-CM | POA: Diagnosis not present

## 2022-12-12 ENCOUNTER — Encounter (HOSPITAL_COMMUNITY)
Admission: RE | Admit: 2022-12-12 | Discharge: 2022-12-12 | Disposition: A | Discharge status: Home / Self Care | Payer: Medicare Other | Source: Ambulatory Visit | Attending: Physician Assistant | Admitting: Physician Assistant

## 2022-12-12 DIAGNOSIS — T84033A Mechanical loosening of internal left knee prosthetic joint, initial encounter: Secondary | ICD-10-CM | POA: Diagnosis not present

## 2022-12-12 DIAGNOSIS — Z96652 Presence of left artificial knee joint: Secondary | ICD-10-CM | POA: Diagnosis not present

## 2022-12-12 DIAGNOSIS — Z471 Aftercare following joint replacement surgery: Secondary | ICD-10-CM | POA: Diagnosis not present

## 2022-12-12 DIAGNOSIS — M25562 Pain in left knee: Secondary | ICD-10-CM | POA: Diagnosis not present

## 2022-12-12 MED ORDER — TECHNETIUM TC 99M MEDRONATE IV KIT
19.6000 | PACK | Freq: Once | INTRAVENOUS | Status: AC
  Administered 2022-12-12: 19.6 via INTRAVENOUS

## 2022-12-26 ENCOUNTER — Ambulatory Visit (INDEPENDENT_AMBULATORY_CARE_PROVIDER_SITE_OTHER): Payer: Medicare Other

## 2022-12-26 ENCOUNTER — Encounter: Payer: Self-pay | Admitting: Podiatry

## 2022-12-26 ENCOUNTER — Ambulatory Visit: Payer: Medicare Other | Admitting: Podiatry

## 2022-12-26 DIAGNOSIS — M2041 Other hammer toe(s) (acquired), right foot: Secondary | ICD-10-CM | POA: Diagnosis not present

## 2022-12-26 DIAGNOSIS — M19011 Primary osteoarthritis, right shoulder: Secondary | ICD-10-CM | POA: Diagnosis not present

## 2022-12-26 NOTE — Progress Notes (Signed)
Subjective:  Patient ID: [name], female    DOB: [birth year],  MRN: [id number] HPI Chief Complaint  Patient presents with   Toe Pain    4th/5th toe right - aching x 6 weeks, "looks like dark knots", also swelling in lateral ankles bilateral (no pain though)    New Patient (Initial Visit)    Est pt 2018    72 y.o. female presents with the above complaint.   ROS: Denies fever chills nausea mobic muscle aches pains calf pain back pain chest pain shortness of breath.  Past Medical History:  Diagnosis Date   Arthritis    Carpal tunnel syndrome    GERD (gastroesophageal reflux disease)    Headache    ocassionally   Hyperlipidemia    Hypertension    Hypothyroidism    Insomnia    Meralgia paresthetica    PONV (postoperative nausea and vomiting)    Thyroid cancer, medullary carcinoma (HCC)    Past Surgical History:  Procedure Laterality Date   COLONOSCOPY     KNEE SURGERY Left    torn cartilage   RESECTION OF MEDIASTINAL MASS N/A 09/11/2017   Procedure: RESECTION OF MEDIASTINAL MASS;  Surgeon: Kerin Perna, MD;  Location: [facility] OR;  Service: Thoracic;  Laterality: N/A;   STERNOTOMY N/A 09/11/2017   Procedure: STERNOTOMY;  Surgeon: Kerin Perna, MD;  Location: [facility] OR;  Service: Thoracic;  Laterality: N/A;   THYROIDECTOMY     TOTAL KNEE ARTHROPLASTY Left 04/06/2020   Procedure: TOTAL KNEE ARTHROPLASTY;  Surgeon: Durene Romans, MD;  Location: [facility] ORS;  Service: Orthopedics;  Laterality: Left;  70 mins    Current Outpatient Medications:    amLODipine (NORVASC) 5 MG tablet, Take 2.5 mg by mouth daily., Disp: , Rfl:    Artificial Tear Solution (SOOTHE XP OP), Place 1 drop into both eyes daily. (Patient not taking: Reported on 03/16/2020), Disp: , Rfl:    conjugated estrogens (PREMARIN) vaginal cream, Place 1 Applicatorful vaginally 2 (two) times a week., Disp: , Rfl:    COVID-19 mRNA Vac-TriS, Pfizer, (PFIZER-BIONT COVID-19 VAC-TRIS) SUSP injection, Inject into the muscle., Disp:  0.3 mL, Rfl: 0   cycloSPORINE (RESTASIS) 0.05 % ophthalmic emulsion, Place 1 drop into both eyes daily., Disp: , Rfl:    HYDROcodone-acetaminophen (NORCO) 7.5-325 MG tablet, Take 1-2 tablets by mouth every 6 (six) hours as needed for severe pain (pain score 7-10)., Disp: 56 tablet, Rfl: 0   levothyroxine (SYNTHROID, LEVOTHROID) 50 MCG tablet, Take 50 mcg by mouth See admin instructions. Take 50 mcg by mouth on Saturday and Sunday, Disp: , Rfl:    levothyroxine (SYNTHROID, LEVOTHROID) 75 MCG tablet, Take 75 mcg by mouth every Monday, Tuesday, Wednesday, Thursday, and Friday. Takes 5 days a week Mon - Fri, Disp: , Rfl:    lisinopril-hydrochlorothiazide (PRINZIDE,ZESTORETIC) 20-12.5 MG tablet, Take 1 tablet by mouth daily. , Disp: , Rfl:    methocarbamol (ROBAXIN) 500 MG tablet, Take 1 tablet (500 mg total) by mouth every 6 (six) hours as needed for muscle spasms., Disp: 40 tablet, Rfl: 0   metoprolol tartrate (LOPRESSOR) 25 MG tablet, Take 25 mg by mouth 2 (two) times daily., Disp: , Rfl:    Polyethyl Glycol-Propyl Glycol (SYSTANE OP), Place 1 drop into both eyes in the morning and at bedtime., Disp: , Rfl:    polyethylene glycol (MIRALAX / GLYCOLAX) 17 g packet, Take 17 g by mouth daily as needed for mild constipation., Disp: 14 each, Rfl: 0   valACYclovir (VALTREX) 500  MG tablet, Take 500 mg by mouth daily as needed (breakouts).  (Patient not taking: Reported on 03/16/2020), Disp: , Rfl: 0   White Petrolatum-Mineral Oil (SYSTANE NIGHTTIME OP), Place 1 drop into both eyes at bedtime. (Patient not taking: Reported on 03/16/2020), Disp: , Rfl:   Current Facility-Administered Medications:    betamethasone acetate-betamethasone sodium phosphate (CELESTONE) injection 3 mg, 3 mg, Intramuscular, Once, Evans, Larena Glassman, DPM  Allergies  Allergen Reactions   Penicillins Nausea And Vomiting    Has patient had a PCN reaction causing immediate rash, facial/tongue/throat swelling, SOB or lightheadedness with  hypotension: no Has patient had a PCN reaction causing severe rash involving mucus membranes or skin necrosis: no Has patient had a PCN reaction that required hospitalization: no Has patient had a PCN reaction occurring within the last 10 years: no If all of the above answers are "NO", then may proceed with Cephalosporin use.  Tolerated Cephalosporin Date: 04/06/20.     Review of Systems Objective:  There were no vitals filed for this visit.  General: Well developed, nourished, in no acute distress, alert and oriented x3   Dermatological: Skin is warm, dry and supple bilateral. Nails x 10 are well maintained; remaining integument appears unremarkable at this time. There are no open sores, no preulcerative lesions, no rash or signs of infection present.  Hyperpigmentation is PIPJ's of the fourth and fifth digits of the right foot.  Vascular: Dorsalis Pedis artery and Posterior Tibial artery pedal pulses are 2/4 bilateral with immedate capillary fill time. Pedal hair growth present. No varicosities and she does have lower extremity edema bilateral pitting in nature as well as visible radiograph.   Neruologic: Grossly intact via light touch bilateral. Vibratory intact via tuning fork bilateral. Protective threshold with Semmes Wienstein monofilament intact to all pedal sites bilateral. Patellar and Achilles deep tendon reflexes 2+ bilateral. No Babinski or clonus noted bilateral.   Musculoskeletal: No gross boney pedal deformities bilateral. No pain, crepitus, or limitation noted with foot and ankle range of motion bilateral. Muscular strength 5/5 in all groups tested bilateral.  Adductovarus rotated hammertoe deformity fifth right contracted fourth metatarsal phalangeal joint with moderate flexibility of the toe fourth right.  Gait: Unassisted, Nonantalgic.    Radiographs:  Radiographs of the forefoot taken today demonstrate osseously mature foot with some demineralization.  She does have  some osteoarthritic changes of the toes at the level of the DIPJ's.  She does have fluid circumferential subcutis.  Assessment & Plan:   Assessment: Contracted fourth toe right with osteoarthritis.  Contracted fifth toe right osteoarthritis.  Left toe  Plan: Discussed etiology pathology conservative surgical therapies at this point edema bilateral discussed compression hose also discussed talking to her doctor about her amlodipine.  She also will follow-up with [place] for extensor tenotomy fourth digit right foot.     Stiven Kaspar T. [place], [place]

## 2022-12-28 DIAGNOSIS — M19011 Primary osteoarthritis, right shoulder: Secondary | ICD-10-CM | POA: Diagnosis not present

## 2023-01-04 DIAGNOSIS — R600 Localized edema: Secondary | ICD-10-CM | POA: Diagnosis not present

## 2023-01-04 DIAGNOSIS — I700 Atherosclerosis of aorta: Secondary | ICD-10-CM | POA: Diagnosis not present

## 2023-01-15 ENCOUNTER — Telehealth: Payer: Self-pay | Admitting: Podiatry

## 2023-01-15 NOTE — Telephone Encounter (Signed)
 Pt called and is having procedure on 1/23 tenotomy and would like you to call her to discuss further. She is asking for more details of the procedure. She said you explained some when she was here but would like to go into more details.

## 2023-01-23 DIAGNOSIS — E039 Hypothyroidism, unspecified: Secondary | ICD-10-CM | POA: Diagnosis not present

## 2023-01-23 DIAGNOSIS — Z85850 Personal history of malignant neoplasm of thyroid: Secondary | ICD-10-CM | POA: Diagnosis not present

## 2023-02-01 ENCOUNTER — Ambulatory Visit: Payer: Medicare Other | Admitting: Podiatry

## 2023-02-01 DIAGNOSIS — E039 Hypothyroidism, unspecified: Secondary | ICD-10-CM | POA: Diagnosis not present

## 2023-02-01 DIAGNOSIS — Z85850 Personal history of malignant neoplasm of thyroid: Secondary | ICD-10-CM | POA: Diagnosis not present

## 2023-02-01 DIAGNOSIS — I10 Essential (primary) hypertension: Secondary | ICD-10-CM | POA: Diagnosis not present

## 2023-02-12 DIAGNOSIS — Z9189 Other specified personal risk factors, not elsewhere classified: Secondary | ICD-10-CM | POA: Diagnosis not present

## 2023-02-13 ENCOUNTER — Telehealth: Payer: Self-pay | Admitting: Podiatry

## 2023-02-13 ENCOUNTER — Ambulatory Visit: Payer: Medicare Other | Admitting: Podiatry

## 2023-02-13 NOTE — Telephone Encounter (Signed)
Patient called asking if she should have her toenail polish removed prior to her procedure. She stated that she left a message with the nurse earlier today. Thank you.

## 2023-02-13 NOTE — Telephone Encounter (Signed)
Called patient and let her know that she does not need to remove her toenail polish for her upcoming procedure.

## 2023-02-19 DIAGNOSIS — H18831 Recurrent erosion of cornea, right eye: Secondary | ICD-10-CM | POA: Diagnosis not present

## 2023-02-20 ENCOUNTER — Ambulatory Visit (INDEPENDENT_AMBULATORY_CARE_PROVIDER_SITE_OTHER): Payer: Medicare Other | Admitting: Podiatry

## 2023-02-20 ENCOUNTER — Encounter: Payer: Self-pay | Admitting: Podiatry

## 2023-02-20 DIAGNOSIS — M24574 Contracture, right foot: Secondary | ICD-10-CM

## 2023-02-20 DIAGNOSIS — M24576 Contracture, unspecified foot: Secondary | ICD-10-CM

## 2023-02-20 NOTE — Patient Instructions (Signed)
Leave bandage in place and dry for 4 days, then remove. You may wash foot normally after removal of bandage. DO NOT SOAK FOOT! Dry completely afterwards and may use a bandaid over incision if needed. We will follow up with you in 1 weeks for recheck.

## 2023-02-21 NOTE — Progress Notes (Signed)
She presents today for a cocked up hammertoe deformity fourth digit right foot.  States that it bothers her with shoe gear.  Objective: Pulses are palpable.  Cocked up hammertoe deformity semiflexible at the PIPJ contracted dorsally at the metatarsophalangeal joint.  Hammertoe deformity fourth right.  Plan: We performed an extensor tenotomy in the office today after local anesthetic was administered.  Obtained written consent for the extensor tenotomy.  It was prepped and draped as normal sterile fashion using an 18-gauge needle I was able to transect the long extensor tendon and the toes that rectus.  The area was lavaged with normal saline and dried and a small amount of Dermabond was placed to the wound.  Dressed a compressive dressing and a Darco shoe was applied she had shoe with her already and I will follow-up with her in 1 week

## 2023-03-01 ENCOUNTER — Ambulatory Visit: Payer: Medicare Other | Admitting: Podiatry

## 2023-03-01 ENCOUNTER — Encounter: Payer: Self-pay | Admitting: Podiatry

## 2023-03-01 DIAGNOSIS — M24576 Contracture, unspecified foot: Secondary | ICD-10-CM

## 2023-03-03 NOTE — Progress Notes (Signed)
 [name] presents today for follow-up of her extensor tenotomy fourth toe right foot.  She states that he just feels a little bit funny but is not painful.  Objective: Vital signs are stable she is alert oriented x 3 there is no erythema edema salines drainage or odor incision site is gone on to heal uneventfully.  She has a nice rectus fourth toe and does not have any pain on palpation of the PIPJ of the fourth toe.  The toe is somewhat flail at this point but will stiffen up over time as this tenotomy goes on to heal deeper structures.  Assessment: Well-healing extensor tenotomy.  Plan: Follow-up with her on an as-needed basis.

## 2023-03-27 DIAGNOSIS — K838 Other specified diseases of biliary tract: Secondary | ICD-10-CM | POA: Diagnosis not present

## 2023-03-27 DIAGNOSIS — K5909 Other constipation: Secondary | ICD-10-CM | POA: Diagnosis not present

## 2023-03-27 DIAGNOSIS — K8689 Other specified diseases of pancreas: Secondary | ICD-10-CM | POA: Diagnosis not present

## 2023-04-02 DIAGNOSIS — E039 Hypothyroidism, unspecified: Secondary | ICD-10-CM | POA: Diagnosis not present

## 2023-04-05 DIAGNOSIS — E559 Vitamin D deficiency, unspecified: Secondary | ICD-10-CM | POA: Diagnosis not present

## 2023-04-05 DIAGNOSIS — R600 Localized edema: Secondary | ICD-10-CM | POA: Diagnosis not present

## 2023-04-05 DIAGNOSIS — Z23 Encounter for immunization: Secondary | ICD-10-CM | POA: Diagnosis not present

## 2023-04-05 DIAGNOSIS — Z0000 Encounter for general adult medical examination without abnormal findings: Secondary | ICD-10-CM | POA: Diagnosis not present

## 2023-04-05 DIAGNOSIS — H8112 Benign paroxysmal vertigo, left ear: Secondary | ICD-10-CM | POA: Diagnosis not present

## 2023-04-05 DIAGNOSIS — I10 Essential (primary) hypertension: Secondary | ICD-10-CM | POA: Diagnosis not present

## 2023-04-05 DIAGNOSIS — E039 Hypothyroidism, unspecified: Secondary | ICD-10-CM | POA: Diagnosis not present

## 2023-04-05 DIAGNOSIS — Z79899 Other long term (current) drug therapy: Secondary | ICD-10-CM | POA: Diagnosis not present

## 2023-04-05 DIAGNOSIS — K5909 Other constipation: Secondary | ICD-10-CM | POA: Diagnosis not present

## 2023-04-05 DIAGNOSIS — Z780 Asymptomatic menopausal state: Secondary | ICD-10-CM | POA: Diagnosis not present

## 2023-04-05 DIAGNOSIS — Z85850 Personal history of malignant neoplasm of thyroid: Secondary | ICD-10-CM | POA: Diagnosis not present

## 2023-04-05 DIAGNOSIS — E785 Hyperlipidemia, unspecified: Secondary | ICD-10-CM | POA: Diagnosis not present

## 2023-04-06 ENCOUNTER — Other Ambulatory Visit: Payer: Self-pay | Admitting: Internal Medicine

## 2023-04-06 DIAGNOSIS — Z780 Asymptomatic menopausal state: Secondary | ICD-10-CM

## 2023-04-26 DIAGNOSIS — I10 Essential (primary) hypertension: Secondary | ICD-10-CM | POA: Diagnosis not present

## 2023-04-26 DIAGNOSIS — E559 Vitamin D deficiency, unspecified: Secondary | ICD-10-CM | POA: Diagnosis not present

## 2023-04-26 DIAGNOSIS — Z79899 Other long term (current) drug therapy: Secondary | ICD-10-CM | POA: Diagnosis not present

## 2023-04-26 DIAGNOSIS — E785 Hyperlipidemia, unspecified: Secondary | ICD-10-CM | POA: Diagnosis not present

## 2023-05-08 ENCOUNTER — Encounter: Payer: Self-pay | Admitting: Podiatry

## 2023-05-08 ENCOUNTER — Ambulatory Visit: Admitting: Podiatry

## 2023-05-08 DIAGNOSIS — G5781 Other specified mononeuropathies of right lower limb: Secondary | ICD-10-CM

## 2023-05-08 MED ORDER — TRIAMCINOLONE ACETONIDE 40 MG/ML IJ SUSP
20.0000 mg | Freq: Once | INTRAMUSCULAR | Status: AC
Start: 2023-05-08 — End: 2023-05-08
  Administered 2023-05-08: 20 mg

## 2023-05-08 NOTE — Progress Notes (Signed)
 She presents today for follow-up of her tenotomy procedure in February she states that that fourth toe has been sore over the past 6 weeks now.  She denies any trauma to the toe.  Objective: Vital signs are stable she is alert and oriented x 3 she has palpable neuroma with a palpable Mulder's click third interdigital space of the foot.  She has good range of motion of the toes the toe itself is mildly tender.  Assessment: Neuroma third interdigital space.  Tenotomy was performed February 2025.  Plan: Discussed etiology pathology conservative versus surgical therapies.  I injected the neuroma today with dexamethasone  and local anesthetic she will follow-up with us  on an as-needed basis.  All

## 2023-05-22 DIAGNOSIS — N1831 Chronic kidney disease, stage 3a: Secondary | ICD-10-CM | POA: Diagnosis not present

## 2023-05-24 ENCOUNTER — Ambulatory Visit
Admission: RE | Admit: 2023-05-24 | Discharge: 2023-05-24 | Disposition: A | Discharge status: Home / Self Care | Source: Ambulatory Visit | Attending: Internal Medicine | Admitting: Internal Medicine

## 2023-05-24 DIAGNOSIS — Z780 Asymptomatic menopausal state: Secondary | ICD-10-CM

## 2023-07-03 ENCOUNTER — Encounter: Payer: Self-pay | Admitting: Podiatry

## 2023-07-03 ENCOUNTER — Ambulatory Visit: Admitting: Podiatry

## 2023-07-03 DIAGNOSIS — K580 Irritable bowel syndrome with diarrhea: Secondary | ICD-10-CM | POA: Insufficient documentation

## 2023-07-03 DIAGNOSIS — M2041 Other hammer toe(s) (acquired), right foot: Secondary | ICD-10-CM

## 2023-07-03 DIAGNOSIS — E559 Vitamin D deficiency, unspecified: Secondary | ICD-10-CM | POA: Insufficient documentation

## 2023-07-03 DIAGNOSIS — I700 Atherosclerosis of aorta: Secondary | ICD-10-CM | POA: Insufficient documentation

## 2023-07-03 DIAGNOSIS — G5781 Other specified mononeuropathies of right lower limb: Secondary | ICD-10-CM

## 2023-07-03 DIAGNOSIS — K5909 Other constipation: Secondary | ICD-10-CM | POA: Insufficient documentation

## 2023-07-03 DIAGNOSIS — G4700 Insomnia, unspecified: Secondary | ICD-10-CM | POA: Insufficient documentation

## 2023-07-03 DIAGNOSIS — N1831 Chronic kidney disease, stage 3a: Secondary | ICD-10-CM | POA: Insufficient documentation

## 2023-07-03 DIAGNOSIS — N952 Postmenopausal atrophic vaginitis: Secondary | ICD-10-CM | POA: Insufficient documentation

## 2023-07-03 DIAGNOSIS — E785 Hyperlipidemia, unspecified: Secondary | ICD-10-CM | POA: Insufficient documentation

## 2023-07-03 DIAGNOSIS — E890 Postprocedural hypothyroidism: Secondary | ICD-10-CM | POA: Insufficient documentation

## 2023-07-03 DIAGNOSIS — I10 Essential (primary) hypertension: Secondary | ICD-10-CM | POA: Insufficient documentation

## 2023-07-03 DIAGNOSIS — H8112 Benign paroxysmal vertigo, left ear: Secondary | ICD-10-CM | POA: Insufficient documentation

## 2023-07-03 DIAGNOSIS — Z85850 Personal history of malignant neoplasm of thyroid: Secondary | ICD-10-CM | POA: Insufficient documentation

## 2023-07-03 NOTE — Progress Notes (Signed)
 She presents today for follow-up of her neuroma and third toe pain.  She is quite irritated because she is a very active individual and states that her foot hurts and she is not improving at all.  Objective: Vital signs are stable alert oriented x 3.  She has no reproducible pain today on palpation in her toes are fine and nontender.  The only area that is somewhat tender is the third interdigital space of the right foot.  She states that the injection did not absolutely nothing so she is not thinking that it is a neuroma.  Assessment: Neuroma right foot  Plan: [place]gu[place] send her to Dr. Silva for second opinion.

## 2023-07-10 ENCOUNTER — Ambulatory Visit: Admitting: Podiatry

## 2023-07-10 ENCOUNTER — Encounter: Payer: Self-pay | Admitting: Podiatry

## 2023-07-10 VITALS — Ht 63.0 in | Wt 142.0 lb

## 2023-07-10 DIAGNOSIS — G5781 Other specified mononeuropathies of right lower limb: Secondary | ICD-10-CM

## 2023-07-10 NOTE — Progress Notes (Signed)
 Subjective:  Patient ID: [name], female    DOB: April 22, 1950,  MRN: [id number]  Chief Complaint  Patient presents with   Toe Pain    Patient is here for 2nd opinion on cyst between 3rd and 4th toe    Discussed the use of AI scribe software for clinical note transcription with the patient, who gave verbal consent to proceed.  History of Present Illness [name] is a 73 year old female with chronic kidney disease who presents with right foot pain.  She initially sought treatment for a hammer toe, she saw Dr. Verta and underwent a tendon release procedure. Despite the procedure, she continued to experience soreness and returned for follow-up. During this visit, she was informed of a possible neuroma between the third and fourth toes and received an injection, which did not alleviate her symptoms.  She experiences persistent discomfort in her right foot, particularly when walking, and describes the pain as 'sore' in a specific area. The pain has limited her footwear options to sandals and sneakers, as other shoes exacerbate her discomfort. She is frustrated with this limitation, stating, 'I'm tired of that.'  Her past medical history includes a bulging disc in her back, which she does not believe causes any shooting pain down her legs. She has a history of breaking her toes, specifically the little toe on her right foot twice and the adjacent toe once, but reports no ongoing issues post-healing. She also mentions arthritis in her toes.  She has chronic kidney disease and notes swelling in her feet and ankles, which she suspects is related to her kidney condition. She attempts to manage this by elevating her legs and engaging in walking for exercise, although her foot pain makes this challenging. Despite the pain, she remains active and attempts to walk regularly.      Objective:    Physical Exam VASCULAR: DP and PT pulse palpable. Foot is warm and well-perfused. Capillary fill  time is brisk. DERMATOLOGIC: Normal skin turgor, texture, and temperature. No open lesions, rashes, or ulcerations. NEUROLOGIC: Normal sensation to light touch and pressure. No paresthesias. ORTHOPEDIC: Tenderness in third interspace on right foot, non-radiating. Pain to palpation with passive stretch of plantar aspect of right foot. Smooth pain-free range of motion of all examined joints. No ecchymosis or bruising. No gross deformity.   No images are attached to the encounter.    Results RADIOLOGY Right foot x-ray: Degenerative changes in the lesser interphalangeal joints, no other pathology   Assessment:   1. Neuroma of third interspace of right foot      Plan:  Patient was evaluated and treated and all questions answered.  Assessment and Plan Assessment & Plan Right foot pain Chronic right foot pain with tenderness in the third interspace and pain on palpation of the plantar portion of the fourth MTP joint. Differential diagnosis includes metatarsalgia, possible Morton's neuroma, capsulitis, or plantar plate injury. Clinical exam and radiographs are inconclusive, showing only mild interphalangeal metatarsophalangeal joint arthritis. - Order MRI of the right forefoot without contrast to evaluate soft tissues, nerve inflammation, and ligaments.  So far has had no improvement with action therapy - Provide metatarsal pads for support and pain relief. - Advise activity as tolerated. - Instruct to report any severe swelling, bruising, or inability to bear weight.  Arthritis of the toes Mild interphalangeal metatarsophalangeal joint arthritis noted on radiographs, contributing to discomfort but not the primary cause of current symptoms.  Chronic kidney disease Chronic kidney disease may  contribute to lower limb swelling due to fluid retention. - Scheduled to see a kidney specialist for further evaluation.      No follow-ups on file.

## 2023-07-10 NOTE — Patient Instructions (Signed)

## 2023-07-11 ENCOUNTER — Other Ambulatory Visit: Payer: Self-pay

## 2023-07-11 DIAGNOSIS — N1831 Chronic kidney disease, stage 3a: Secondary | ICD-10-CM | POA: Diagnosis not present

## 2023-07-11 DIAGNOSIS — I129 Hypertensive chronic kidney disease with stage 1 through stage 4 chronic kidney disease, or unspecified chronic kidney disease: Secondary | ICD-10-CM | POA: Diagnosis not present

## 2023-07-16 ENCOUNTER — Ambulatory Visit
Admission: RE | Admit: 2023-07-16 | Discharge: 2023-07-16 | Disposition: A | Discharge status: Home / Self Care | Source: Ambulatory Visit | Attending: Podiatry | Admitting: Podiatry

## 2023-07-16 DIAGNOSIS — G5781 Other specified mononeuropathies of right lower limb: Secondary | ICD-10-CM

## 2023-07-16 DIAGNOSIS — R936 Abnormal findings on diagnostic imaging of limbs: Secondary | ICD-10-CM | POA: Diagnosis not present

## 2023-07-16 DIAGNOSIS — R600 Localized edema: Secondary | ICD-10-CM | POA: Diagnosis not present

## 2023-07-17 ENCOUNTER — Ambulatory Visit
Admission: RE | Admit: 2023-07-17 | Discharge: 2023-07-17 | Disposition: A | Discharge status: Home / Self Care | Source: Ambulatory Visit

## 2023-07-17 DIAGNOSIS — N1831 Chronic kidney disease, stage 3a: Secondary | ICD-10-CM

## 2023-07-17 DIAGNOSIS — N183 Chronic kidney disease, stage 3 unspecified: Secondary | ICD-10-CM | POA: Diagnosis not present

## 2023-07-17 DIAGNOSIS — I10 Essential (primary) hypertension: Secondary | ICD-10-CM | POA: Diagnosis not present

## 2023-08-07 ENCOUNTER — Ambulatory Visit: Admitting: Podiatry

## 2023-08-07 VITALS — Ht 63.0 in | Wt 142.0 lb

## 2023-08-07 DIAGNOSIS — G5761 Lesion of plantar nerve, right lower limb: Secondary | ICD-10-CM

## 2023-08-07 NOTE — Progress Notes (Signed)
 Subjective:  Patient ID: [name], female    DOB: 1950/11/29,  MRN: [id number]  Chief Complaint  Patient presents with   Foot Pain    Rm 2 Patient is here to discuss MRI results and further course of treatment.    Discussed the use of AI scribe software for clinical note transcription with the patient, who gave verbal consent to proceed.  History of Present Illness [name] is a 73 year old female with chronic kidney disease who presents with right foot pain. She returns for follow-up today she notes no change in symptoms.  Completed the MRI.      Objective:    Physical Exam VASCULAR: DP and PT pulse palpable. Foot is warm and well-perfused. Capillary fill time is brisk. DERMATOLOGIC: Normal skin turgor, texture, and temperature. No open lesions, rashes, or ulcerations. NEUROLOGIC: Normal sensation to light touch and pressure. No paresthesias. ORTHOPEDIC: Tenderness in second interspace on right foot, non-radiating.  No instability to indicate plantar plate tear        Results RADIOLOGY Right foot x-ray: Degenerative changes in the lesser interphalangeal joints, no other pathology  Narrative & Impression  CLINICAL DATA:  Neuroma   EXAM: MRI OF THE RIGHT TOES WITHOUT CONTRAST   TECHNIQUE: Multiplanar, multisequence MR imaging of the right forefoot was performed. No intravenous contrast was administered.   COMPARISON:  Radiographs 12/26/2022   FINDINGS: Bones/Joint/Cartilage   Bipartite medial cuneiform with low-level marrow edema along the cleft as on image 38 series 3 and image 38 series 4.   Mild degenerative subcortical foci of edema along the talar head and lateral navicular.   Ligaments   The Lisfranc ligament appears intact.   Muscles and Tendons   Unremarkable   Soft tissues   Localized accentuated T2 and low T1 signal in the soft tissues plantar to the second MTP joint on 16 series 3. This is fairly indistinctly marginated and  may simply be inflammatory.   There is some accentuated low T1 signal between the second and third metatarsal heads as shown on image 17 series 4 which could potentially reflect Morton's neuroma, correlate with patient's symptoms.   Small amount of plantar edema signal below the fifth MTP joint for example on image 23 series 3, likely inflammatory or from mild adventitial bursitis.   IMPRESSION: 1. Accentuated low T1 signal between the second and third metatarsal heads which could potentially reflect a small neuroma, correlate with patient's symptoms. 2. Localized accentuated T2 and low T1 signal in the soft tissues plantar to the second MTP joint, fairly indistinctly marginated and may simply be inflammatory. 3. Small amount of plantar edema signal below the fifth MTP joint, likely inflammatory or from mild adventitial bursitis. 4. Bipartite medial cuneiform with low-level marrow edema along the cleft. 5. Mild degenerative subcortical foci of edema along the talar head and lateral navicular.     Electronically Signed   By: Ryan Salvage M.D.   On: 07/17/2023 11:49     Assessment:   1. Morton's neuroma of right foot       Plan:  Patient was evaluated and treated and all questions answered.  Assessment and Plan Assessment & Plan Right foot pain We reviewed her MRI results and discussed the presence of the neuroma which is still symptomatic.  We discussed that diagnosis of neuroma is typically a clinical diagnosis and I do not routinely order MRIs before performing an injection.  The MRI does show a neuroma in the second interspace.  I recommended injection therapy today we also discussed offloading options for metatarsal pads and surgical treatment options including decompression of the intermetatarsal space as well as neurectomy and the indications for each of these.  She agreed to proceed with injection therapy again today.  Following sterile prep with alcohol  from a  dorsal approach to the plantar second interspace at the level of maximal tenderness was injected with 10 mg Kenalog  4 mg of dexamethasone  and 5 mg of Marcaine .  She tolerated this well and was dressed with a Band-Aid.  I will see her back in 6 weeks to reevaluate.        Return in about 6 weeks (around 09/18/2023) for f/u neuroma injection.

## 2023-08-09 ENCOUNTER — Ambulatory Visit: Admitting: Podiatry

## 2023-08-13 DIAGNOSIS — M19011 Primary osteoarthritis, right shoulder: Secondary | ICD-10-CM | POA: Diagnosis not present

## 2023-09-18 ENCOUNTER — Ambulatory Visit (INDEPENDENT_AMBULATORY_CARE_PROVIDER_SITE_OTHER): Admitting: Podiatry

## 2023-09-18 VITALS — Ht 63.0 in | Wt 142.0 lb

## 2023-09-18 DIAGNOSIS — M2041 Other hammer toe(s) (acquired), right foot: Secondary | ICD-10-CM | POA: Diagnosis not present

## 2023-09-18 DIAGNOSIS — G5761 Lesion of plantar nerve, right lower limb: Secondary | ICD-10-CM | POA: Diagnosis not present

## 2023-09-18 NOTE — Progress Notes (Signed)
 Subjective:  Patient ID: [name], female    DOB: 01/16/1950,  MRN: [id number]  Chief Complaint  Patient presents with   Neuroma    Rm 4 Pt Is here for Morton's neuroma of the right foot. Pt states great improvement in pain.    Discussed the use of AI scribe software for clinical note transcription with the patient, who gave verbal consent to proceed.  History of Present Illness She returns for follow-up notes significant improvement about 60% better after the injection      Objective:    Physical Exam VASCULAR: DP and PT pulse palpable. Foot is warm and well-perfused. Capillary fill time is brisk. DERMATOLOGIC: Normal skin turgor, texture, and temperature. No open lesions, rashes, or ulcerations. NEUROLOGIC: Normal sensation to light touch and pressure. No paresthesias. ORTHOPEDIC: Only light tenderness in the second or space today        Results RADIOLOGY Right foot x-ray: Degenerative changes in the lesser interphalangeal joints, no other pathology  Narrative & Impression  CLINICAL DATA:  Neuroma   EXAM: MRI OF THE RIGHT TOES WITHOUT CONTRAST   TECHNIQUE: Multiplanar, multisequence MR imaging of the right forefoot was performed. No intravenous contrast was administered.   COMPARISON:  Radiographs 12/26/2022   FINDINGS: Bones/Joint/Cartilage   Bipartite medial cuneiform with low-level marrow edema along the cleft as on image 38 series 3 and image 38 series 4.   Mild degenerative subcortical foci of edema along the talar head and lateral navicular.   Ligaments   The Lisfranc ligament appears intact.   Muscles and Tendons   Unremarkable   Soft tissues   Localized accentuated T2 and low T1 signal in the soft tissues plantar to the second MTP joint on 16 series 3. This is fairly indistinctly marginated and may simply be inflammatory.   There is some accentuated low T1 signal between the second and third metatarsal heads as shown on image 17  series 4 which could potentially reflect Morton's neuroma, correlate with patient's symptoms.   Small amount of plantar edema signal below the fifth MTP joint for example on image 23 series 3, likely inflammatory or from mild adventitial bursitis.   IMPRESSION: 1. Accentuated low T1 signal between the second and third metatarsal heads which could potentially reflect a small neuroma, correlate with patient's symptoms. 2. Localized accentuated T2 and low T1 signal in the soft tissues plantar to the second MTP joint, fairly indistinctly marginated and may simply be inflammatory. 3. Small amount of plantar edema signal below the fifth MTP joint, likely inflammatory or from mild adventitial bursitis. 4. Bipartite medial cuneiform with low-level marrow edema along the cleft. 5. Mild degenerative subcortical foci of edema along the talar head and lateral navicular.     Electronically Signed   By: Ryan Salvage M.D.   On: 07/17/2023 11:49     Assessment:   1. Morton's neuroma of right foot   2. Hammertoe of right foot       Plan:  Patient was evaluated and treated and all questions answered.  Assessment and Plan Assessment & Plan Right foot pain Doing much better after the injection.  We discussed further injection therapy and she also inquired about resting alcohol  injections and we discussed the rationale and treatment process of these as well.  So far she has made improvement with 1 injection and she is going to try different shoes to see if this alleviates pain and pressure as well.  Discussed we could continue to give this  time to heal further and she will follow-up me in 6 weeks for reevaluation if still painful then we will plan for reevaluation and if still painful plan for an injection.  If becomes more painful sooner we will consider sclerosing alcohol  injections as well.        Return in about 6 weeks (around 10/30/2023) for f/u neuroma (injection if still in  pain).

## 2023-09-24 ENCOUNTER — Other Ambulatory Visit: Payer: Self-pay | Admitting: Internal Medicine

## 2023-09-24 DIAGNOSIS — Z1231 Encounter for screening mammogram for malignant neoplasm of breast: Secondary | ICD-10-CM

## 2023-10-12 DIAGNOSIS — I10 Essential (primary) hypertension: Secondary | ICD-10-CM | POA: Diagnosis not present

## 2023-10-12 DIAGNOSIS — N1831 Chronic kidney disease, stage 3a: Secondary | ICD-10-CM | POA: Diagnosis not present

## 2023-10-13 DIAGNOSIS — Z23 Encounter for immunization: Secondary | ICD-10-CM | POA: Diagnosis not present

## 2023-10-19 ENCOUNTER — Telehealth (HOSPITAL_BASED_OUTPATIENT_CLINIC_OR_DEPARTMENT_OTHER): Payer: Self-pay

## 2023-10-19 NOTE — Telephone Encounter (Signed)
 appearantly pt specifically asked her for Dr Raford and refused to see Dr Sheena

## 2023-10-23 ENCOUNTER — Ambulatory Visit (INDEPENDENT_AMBULATORY_CARE_PROVIDER_SITE_OTHER): Admitting: Cardiovascular Disease

## 2023-10-23 ENCOUNTER — Other Ambulatory Visit (HOSPITAL_BASED_OUTPATIENT_CLINIC_OR_DEPARTMENT_OTHER): Payer: Self-pay

## 2023-10-23 ENCOUNTER — Encounter (HOSPITAL_BASED_OUTPATIENT_CLINIC_OR_DEPARTMENT_OTHER): Payer: Self-pay | Admitting: Cardiovascular Disease

## 2023-10-23 VITALS — BP 144/72 | HR 91 | Ht 63.0 in | Wt 135.5 lb

## 2023-10-23 DIAGNOSIS — E782 Mixed hyperlipidemia: Secondary | ICD-10-CM | POA: Diagnosis not present

## 2023-10-23 DIAGNOSIS — N1831 Chronic kidney disease, stage 3a: Secondary | ICD-10-CM | POA: Diagnosis not present

## 2023-10-23 DIAGNOSIS — I700 Atherosclerosis of aorta: Secondary | ICD-10-CM | POA: Diagnosis not present

## 2023-10-23 DIAGNOSIS — I10 Essential (primary) hypertension: Secondary | ICD-10-CM

## 2023-10-23 MED ORDER — ROSUVASTATIN CALCIUM 20 MG PO TABS: 20.0000 mg | ORAL_TABLET | Freq: Every day | ORAL | 3 refills | Status: DC

## 2023-10-23 MED ORDER — OLMESARTAN MEDOXOMIL-HCTZ 40-12.5 MG PO TABS: 1.0000 | ORAL_TABLET | Freq: Every day | ORAL | 3 refills | Status: DC

## 2023-10-23 MED ORDER — OLMESARTAN MEDOXOMIL-HCTZ 40-12.5 MG PO TABS
1.0000 | ORAL_TABLET | Freq: Every day | ORAL | 3 refills | Status: DC
  Filled 2023-10-23: qty 90, 90d supply, fill #0

## 2023-10-23 MED ORDER — ROSUVASTATIN CALCIUM 20 MG PO TABS
20.0000 mg | ORAL_TABLET | Freq: Every day | ORAL | 3 refills | Status: DC
  Filled 2023-10-23: qty 90, 90d supply, fill #0

## 2023-10-23 NOTE — Progress Notes (Signed)
 Cardiology Office Note:  .   Date:  10/23/2023  ID:  [name], DOB 11/05/1950, MRN [id number] PCP: Dwight Trula SQUIBB, MD  [facility] Providers Cardiologist:  None    History of Present Illness: .    [name] is a 73 y.o. female with aortic atherosclerosis, CKD 3a, hypertension, and hyperlipidemia here for evaluation of her hyperlipidemia.  She has been managed on hydration to chlorothiazide, lisinopril , and amlodipine  for her blood pressure.  Her PCP recommended increasing lisinopril  but she wanted to wait until this appointment.  PREVENT 10 year risk calculator: 15.5%.  Discussed the use of AI scribe software for clinical note transcription with the patient, who gave verbal consent to proceed.  History of Present Illness Ms. [name] is concerned about her blood pressure, which tends to elevate during doctor visits but is usually around 130/68-69 mmHg at home. She is interested in lowering it further and acknowledges stress related to her son's disability as a contributing factor. Her current medications include lisinopril , amlodipine  2.5 mg, and vitamin D. She previously took metoprolol , which was discontinued as it was not effective for her blood pressure.  She is focused on lowering her cholesterol, which has decreased from the previous year but remains higher than desired. She has not been on cholesterol medications before and is attempting lifestyle modifications to manage it. Her family history includes her father who died of a heart attack in his 71s, a brother with atrial fibrillation, and multiple family members with high blood pressure, including her husband, daughter, brother, sister, and mother.  She has chronic kidney disease, which was identified by her thyroid  doctor. Her most recent GFR was 51 and creatinine was 1.12. She is cautious about medications affecting her kidney function and avoids dark sodas and NSAIDs, using Tylenol  instead. She cooks at home, limits  salt, and drinks decaf coffee.  She experiences swelling in her feet, particularly at night, which has been ongoing for about three years. Various tests have been conducted without identifying a cause. The swelling has been less severe recently. She exercises regularly, including weight exercises and walking, which makes her feel better. No chest pain and reports good breathing.   ROS:  As per HPI  Studies Reviewed: SABRA   EKG Interpretation Date/Time:  Tuesday October 23 2023 09:40:08 EDT Ventricular Rate:  82 PR Interval:  216 QRS Duration:  80 QT Interval:  364 QTC Calculation: 425 R Axis:   79  Text Interpretation: Sinus rhythm with 1st degree A-V block Cannot rule out Anterior infarct (cited on or before 23-Oct-2023) When compared with ECG of 29-Mar-2020 13:51, No significant change was found Confirmed by Raford Riggs ([id number]) on 10/23/2023 9:45:14 AM   05/02/23: Total cholesterol 226, triglycerides 95, HDL 68, LDL 141 TSH 1.62 Sodium 137, potassium 4.5, BUN 18, creatinine 1.17   EKG Interpretation Date/Time:  Tuesday October 23 2023 09:40:08 EDT Ventricular Rate:  82 PR Interval:  216 QRS Duration:  80 QT Interval:  364 QTC Calculation: 425 R Axis:   79  Text Interpretation: Sinus rhythm with 1st degree A-V block Cannot rule out Anterior infarct (cited on or before 23-Oct-2023) When compared with ECG of 29-Mar-2020 13:51, No significant change was found Confirmed by Raford Riggs ([id number]) on 10/23/2023 9:45:14 AM         Risk Assessment/Calculations:     HYPERTENSION CONTROL Vitals:   10/23/23 0846 10/23/23 0929 10/23/23 0930  BP: (!) 142/68 138/74 (!) 144/72    The patient's  blood pressure is elevated above target today.  In order to address the patient's elevated BP: A new medication was prescribed today.          Physical Exam:   VS:  BP (!) 144/72 (BP Location: Left Arm)   Pulse 91   Ht 5' 3 (1.6 m)   Wt 135 lb 8 oz (61.5 kg)   SpO2 99%   BMI  24.00 kg/m  , BMI Body mass index is 24 kg/m. GENERAL:  Well appearing HEENT: Pupils equal round and reactive, fundi not visualized, oral mucosa unremarkable NECK:  No jugular venous distention, waveform within normal limits, carotid upstroke brisk and symmetric, no bruits, no thyromegaly LUNGS:  Clear to auscultation bilaterally HEART:  RRR.  PMI not displaced or sustained,S1 and S2 within normal limits, no S3, no S4, no clicks, no rubs, no murmurs ABD:  Flat, positive bowel sounds normal in frequency in pitch, no bruits, no rebound, no guarding, no midline pulsatile mass, no hepatomegaly, no splenomegaly EXT:  2 plus pulses throughout, no edema, no cyanosis no clubbing SKIN:  No rashes no nodules NEURO:  Cranial nerves II through XII grossly intact, motor grossly intact throughout PSYCH:  Cognitively intact, oriented to person place and time   ASSESSMENT AND PLAN: .    Assessment & Plan # Essential hypertension with chronic kidney disease stage 3a and lower extremity edema Hypertension not optimally controlled. GFR at 51, creatinine at 1.12. Edema likely due to amlodipine . Discussed lisinopril  side effects, particularly angioedema risk in African Americans. Emphasized blood pressure control for kidney protection.  PREVENT 10 year risk calculator: 15.5%. - Switch lisinopril  to olmesartan with hydrochlorothiazide  combination. 40/12.5mg  dialy. - Maintain current dose of amlodipine .  Consider stopping in the future to help with LE edema.  - Order renin and aldosterone labs in one week after starting new medication.  AM labs.  - Monitor blood pressure at home and record readings.  BP goal <130/80. - Provide hypertension education materials and tracking sheets. - Schedule follow-up in one month with hypertension clinic staff.  # Mixed hyperlipidemia with atherosclerosis of aorta LDL at 141, goal less than 70 due to aortic calcification. Ten-year cardiovascular risk approximately 15%.  Discussed lifestyle modifications and statin therapy benefits and risks. - Initiate rosuvastatin therapy. - Recheck cholesterol levels, kidney function, and liver function in 2-3 months.  Recording duration: 29 minutes        Dispo: f/u 1 month PharmD/APP.  4 months with MD.  Signed, Annabella Scarce, MD

## 2023-10-23 NOTE — Patient Instructions (Addendum)
 EKG looks okay today  Medication Instructions:  Your physician has recommended you make the following change in your medication:  Stop lisinopril -hydrochlorothiazide  Start olmesartan-hydrochlorothiazide  40/12.5 mg - take one tablet daily Start rosuvastatin 20 mg - take one tablet daily  *If you need a refill on your cardiac medications before your next appointment, please call your pharmacy*  Lab Work: Your physician recommends that you return for lab work in:  1 week --renin, aldosterone, basic metabolic panel 8 weeks - fasting lipid panel, complete metabolic panel  Testing/Procedures: none  Follow-Up: At [facility], you and your health needs are our priority.  As part of our continuing mission to provide you with exceptional heart care, our providers are all part of one team.  This team includes your primary Cardiologist (physician) and Advanced Practice Providers or APPs (Physician Assistants and Nurse Practitioners) who all work together to provide you with the care you need, when you need it.  Your next appointment:   About 1 month with Rosaline Bane, NP or Reche Finder, NP And then about 4 months with Dr. Raford

## 2023-10-30 ENCOUNTER — Ambulatory Visit: Admitting: Podiatry

## 2023-10-30 ENCOUNTER — Ambulatory Visit
Admission: RE | Admit: 2023-10-30 | Discharge: 2023-10-30 | Disposition: A | Source: Ambulatory Visit | Attending: Internal Medicine | Admitting: Internal Medicine

## 2023-10-30 VITALS — Ht 63.0 in | Wt 135.5 lb

## 2023-10-30 DIAGNOSIS — Z1231 Encounter for screening mammogram for malignant neoplasm of breast: Secondary | ICD-10-CM | POA: Diagnosis not present

## 2023-10-30 DIAGNOSIS — M65979 Unspecified synovitis and tenosynovitis, unspecified ankle and foot: Secondary | ICD-10-CM | POA: Diagnosis not present

## 2023-10-30 DIAGNOSIS — M2041 Other hammer toe(s) (acquired), right foot: Secondary | ICD-10-CM | POA: Diagnosis not present

## 2023-10-30 DIAGNOSIS — G5761 Lesion of plantar nerve, right lower limb: Secondary | ICD-10-CM | POA: Diagnosis not present

## 2023-10-31 NOTE — Progress Notes (Signed)
 Subjective:  Patient ID: [name], female    DOB: 1950/11/27,  MRN: [id number]  Chief Complaint  Patient presents with   Foot Problem    Rm Patient is here  to f/u on a morton's neuroma of right foot. Pt states some intermittent pain when walking.Pt states pain in the right 4th toe. Pt states no injury to toe.    Discussed the use of AI scribe software for clinical note transcription with the patient, who gave verbal consent to proceed.  History of Present Illness She returns for follow-up doing better from the nerve pain still has pain in the fourth toe on the right foot as well as the top of the right foot      Objective:    Physical Exam VASCULAR: DP and PT pulse palpable. Foot is warm and well-perfused. Capillary fill time is brisk. DERMATOLOGIC: Normal skin turgor, texture, and temperature. No open lesions, rashes, or ulcerations. NEUROLOGIC: Normal sensation to light touch and pressure. No paresthesias. ORTHOPEDIC: No pain in the second interspace at the neuroma site today.  Tenderness in the MTP joint of the fourth toe.  Dorsal spurring first TMT with tenderness        Results RADIOLOGY Right foot x-ray: Degenerative changes in the lesser interphalangeal joints, no other pathology  Narrative & Impression  CLINICAL DATA:  Neuroma   EXAM: MRI OF THE RIGHT TOES WITHOUT CONTRAST   TECHNIQUE: Multiplanar, multisequence MR imaging of the right forefoot was performed. No intravenous contrast was administered.   COMPARISON:  Radiographs 12/26/2022   FINDINGS: Bones/Joint/Cartilage   Bipartite medial cuneiform with low-level marrow edema along the cleft as on image 38 series 3 and image 38 series 4.   Mild degenerative subcortical foci of edema along the talar head and lateral navicular.   Ligaments   The Lisfranc ligament appears intact.   Muscles and Tendons   Unremarkable   Soft tissues   Localized accentuated T2 and low T1 signal in the soft  tissues plantar to the second MTP joint on 16 series 3. This is fairly indistinctly marginated and may simply be inflammatory.   There is some accentuated low T1 signal between the second and third metatarsal heads as shown on image 17 series 4 which could potentially reflect Morton's neuroma, correlate with patient's symptoms.   Small amount of plantar edema signal below the fifth MTP joint for example on image 23 series 3, likely inflammatory or from mild adventitial bursitis.   IMPRESSION: 1. Accentuated low T1 signal between the second and third metatarsal heads which could potentially reflect a small neuroma, correlate with patient's symptoms. 2. Localized accentuated T2 and low T1 signal in the soft tissues plantar to the second MTP joint, fairly indistinctly marginated and may simply be inflammatory. 3. Small amount of plantar edema signal below the fifth MTP joint, likely inflammatory or from mild adventitial bursitis. 4. Bipartite medial cuneiform with low-level marrow edema along the cleft. 5. Mild degenerative subcortical foci of edema along the talar head and lateral navicular.     Electronically Signed   By: Ryan Salvage M.D.   On: 07/17/2023 11:49     Assessment:   1. Synovitis of toe   2. Hammertoe of right foot   3. Morton's neuroma of right foot       Plan:  Patient was evaluated and treated and all questions answered.  Assessment and Plan Assessment & Plan Right foot pain Neuroma has essentially resolved.  Has some tenderness in  the fourth toe.  We discussed injection therapy of this joint.  She is going to try Voltaren gel for this as well as the dorsal foot pain as well which is a dorsal spur compressing the peroneal nerve.  Return in 6 weeks to reevaluate if no improvement plan for injection at that point.  We also discussed surgical correction of the spur in the toe if that does not improve further.        Return in 6 weeks (on  12/11/2023) for R foot pain f/u .

## 2023-11-07 LAB — BASIC METABOLIC PANEL WITH GFR
BUN/Creatinine Ratio: 17 (ref 12–28)
BUN: 18 mg/dL (ref 8–27)
CO2: 25 mmol/L (ref 20–29)
Calcium: 9.1 mg/dL (ref 8.7–10.3)
Chloride: 101 mmol/L (ref 96–106)
Creatinine, Ser: 1.06 mg/dL — ABNORMAL HIGH (ref 0.57–1.00)
Glucose: 99 mg/dL (ref 70–99)
Potassium: 4.3 mmol/L (ref 3.5–5.2)
Sodium: 142 mmol/L (ref 134–144)
eGFR: 55 mL/min/1.73 — ABNORMAL LOW (ref >59)

## 2023-11-07 LAB — ALDOSTERONE + RENIN ACTIVITY W/ RATIO
Aldos/Renin Ratio: 2.6 (ref 0.0–30.0)
Aldosterone: 7.3 ng/dL (ref 0.0–30.0)
Renin Activity, Plasma: 2.771 ng/mL/h (ref 0.167–5.380)

## 2023-11-09 ENCOUNTER — Telehealth: Payer: Self-pay | Admitting: Cardiovascular Disease

## 2023-11-09 NOTE — Telephone Encounter (Signed)
 Pt would like a c/b regarding lab results. Please advise

## 2023-11-12 NOTE — Telephone Encounter (Signed)
 Mychart message sent 10/31

## 2023-11-23 ENCOUNTER — Ambulatory Visit (HOSPITAL_BASED_OUTPATIENT_CLINIC_OR_DEPARTMENT_OTHER): Payer: Self-pay | Admitting: Family

## 2023-11-23 NOTE — Telephone Encounter (Signed)
 Results sent via MyChart on the lab.   [name] S Eliska Hamil, NP
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
## 2023-11-26 ENCOUNTER — Ambulatory Visit (HOSPITAL_BASED_OUTPATIENT_CLINIC_OR_DEPARTMENT_OTHER): Admitting: Emergency Medicine

## 2023-11-26 ENCOUNTER — Encounter (HOSPITAL_BASED_OUTPATIENT_CLINIC_OR_DEPARTMENT_OTHER): Payer: Self-pay | Admitting: Emergency Medicine

## 2023-11-26 VITALS — BP 124/72 | HR 82 | Ht 63.0 in | Wt 139.4 lb

## 2023-11-26 DIAGNOSIS — I700 Atherosclerosis of aorta: Secondary | ICD-10-CM

## 2023-11-26 DIAGNOSIS — R011 Cardiac murmur, unspecified: Secondary | ICD-10-CM

## 2023-11-26 DIAGNOSIS — E782 Mixed hyperlipidemia: Secondary | ICD-10-CM | POA: Diagnosis not present

## 2023-11-26 DIAGNOSIS — I10 Essential (primary) hypertension: Secondary | ICD-10-CM

## 2023-11-26 DIAGNOSIS — N1831 Chronic kidney disease, stage 3a: Secondary | ICD-10-CM

## 2023-11-26 NOTE — Assessment & Plan Note (Addendum)
 Patient denies routine monitoring of BP at home Within normal limits in office today (124/72) Denies dizziness, lightheadedness, near-syncope, palpitations Continue amlodipine  2.5 mg daily Continue olmesartan-hydrochlorothiazide  40-12.5 mg daily Creatinine 1.06 on recent blood work, unchanged from 2022 Patient has BP cuff at home, states she will start monitoring and logging her BP today Advised to call the office if she regularly sees BPs averaging above 130s/80s or if she develops any new/concerning symptoms (dizziness, lightheadedness, near syncope, headache, blurry vision, etc)

## 2023-11-26 NOTE — Assessment & Plan Note (Signed)
 BMP 11/01/2023: eGFR 55, creatinine 1.06 Creatinine remaining stable since prior labs in 2022, no previous eGFR to compare Will continue to monitor

## 2023-11-26 NOTE — Patient Instructions (Addendum)
 Medication Instructions:  Continue your current medications.   *If you need a refill on your cardiac medications before your next appointment, please call your pharmacy*  Lab Work: Your physician recommends that you return for lab work mid December for fasting cholesterol panel.   Please have this collected at [facility] [place]. The lab is open 8:00 am - 4:30 pm. Please avoid 12:00p - 1:00p for lunch hour. You do not need an appointment. Please go to [address] [place], [place] [address]. This is in the Primary Care office on the 3rd floor, let them know you are there for blood work and they will direct you to the lab.   If you have labs (blood work) drawn today and your tests are completely normal, you will receive your results only by: MyChart Message (if you have MyChart) OR A paper copy in the mail If you have any lab test that is abnormal or we need to change your treatment, we will call you to review the results.  Follow-Up: At [facility], you and your health needs are our priority.  As part of our continuing mission to provide you with exceptional heart care, our providers are all part of one team.  This team includes your primary Cardiologist (physician) and Advanced Practice Providers or APPs (Physician Assistants and Nurse Practitioners) who all work together to provide you with the care you need, when you need it.  Your next appointment:   As scheduled in February with Dr. Raford  We recommend signing up for the patient portal called MyChart.  Sign up information is provided on this After Visit Summary.  MyChart is used to connect with patients for Virtual Visits (Telemedicine).  Patients are able to view lab/test results, encounter notes, upcoming appointments, etc.  Non-urgent messages can be sent to your provider as well.   To learn more about what you can do with MyChart, go to [email or web address].   Other Instructions  If  your blood pressure at home is consistent more than 130/80, please call and let us  know. If you are experiencing lightheadedness or dizziness, please call and let us  know.  To prevent or reduce lower extremity swelling: Eat a low salt diet. Salt makes the body hold onto extra fluid which causes swelling. Sit with legs elevated. For example, in the recliner or on an ottoman.  Wear knee-high compression stockings during the daytime. Ones labeled 15-20 mmHg provide good compression.

## 2023-11-26 NOTE — Assessment & Plan Note (Addendum)
 Recently started on rosuvastatin 20 mg daily at prior visit CMP and fasting lipids are to be drawn in December, lab orders placed by Dr. Raford

## 2023-11-29 ENCOUNTER — Telehealth: Payer: Self-pay | Admitting: Cardiovascular Disease

## 2023-11-29 NOTE — Telephone Encounter (Signed)
Agree with plan as provided by nursing team .  Alver Sorrow, NP

## 2023-11-29 NOTE — Telephone Encounter (Signed)
 Spoke with patient and reviewed how to check her blood pressure at home.  Concerned because blood pressure is fluctuating 120's-140's.  Advised to continue to monitor twice a day as advised by Reche ORN NP If consistently above 130 to call back   Patient verbalized understanding

## 2023-11-29 NOTE — Telephone Encounter (Signed)
  Patient would like to speak with a nurse. She has some questions about whether she is taking her BP correctly. She thinks maybe she isn't using her monitor correctly.

## 2023-12-11 ENCOUNTER — Ambulatory Visit: Admitting: Podiatry

## 2023-12-11 DIAGNOSIS — M65979 Unspecified synovitis and tenosynovitis, unspecified ankle and foot: Secondary | ICD-10-CM

## 2023-12-11 DIAGNOSIS — M65871 Other synovitis and tenosynovitis, right ankle and foot: Secondary | ICD-10-CM

## 2023-12-11 NOTE — Progress Notes (Signed)
 Subjective:  Patient ID: [name], female    DOB: 1950/06/30,  MRN: [id number]  Chief Complaint  Patient presents with   Foot Pain    Pt stated that she is here for a follow up for her foot pain she stated that she is still having some pain     Discussed the use of AI scribe software for clinical note transcription with the patient, who gave verbal consent to proceed.  History of Present Illness She returns for follow-up still notes pain in her foot from the fourth toe which is the worst and neuroma less so but still did bother her in the top of the foot, shoes have helped some but not      Objective:    Physical Exam VASCULAR: DP and PT pulse palpable. Foot is warm and well-perfused. Capillary fill time is brisk. DERMATOLOGIC: Normal skin turgor, texture, and temperature. No open lesions, rashes, or ulcerations. NEUROLOGIC: Normal sensation to light touch and pressure. No paresthesias. ORTHOPEDIC: Minimal pain in the second interspace at the neuroma site today.  Tenderness in the MTP joint of the fourth toe.  Dorsal spurring first TMT with tenderness        Results RADIOLOGY Right foot x-ray: Degenerative changes in the lesser interphalangeal joints, no other pathology  Narrative & Impression  CLINICAL DATA:  Neuroma   EXAM: MRI OF THE RIGHT TOES WITHOUT CONTRAST   TECHNIQUE: Multiplanar, multisequence MR imaging of the right forefoot was performed. No intravenous contrast was administered.   COMPARISON:  Radiographs 12/26/2022   FINDINGS: Bones/Joint/Cartilage   Bipartite medial cuneiform with low-level marrow edema along the cleft as on image 38 series 3 and image 38 series 4.   Mild degenerative subcortical foci of edema along the talar head and lateral navicular.   Ligaments   The Lisfranc ligament appears intact.   Muscles and Tendons   Unremarkable   Soft tissues   Localized accentuated T2 and low T1 signal in the soft tissues plantar  to the second MTP joint on 16 series 3. This is fairly indistinctly marginated and may simply be inflammatory.   There is some accentuated low T1 signal between the second and third metatarsal heads as shown on image 17 series 4 which could potentially reflect Morton's neuroma, correlate with patient's symptoms.   Small amount of plantar edema signal below the fifth MTP joint for example on image 23 series 3, likely inflammatory or from mild adventitial bursitis.   IMPRESSION: 1. Accentuated low T1 signal between the second and third metatarsal heads which could potentially reflect a small neuroma, correlate with patient's symptoms. 2. Localized accentuated T2 and low T1 signal in the soft tissues plantar to the second MTP joint, fairly indistinctly marginated and may simply be inflammatory. 3. Small amount of plantar edema signal below the fifth MTP joint, likely inflammatory or from mild adventitial bursitis. 4. Bipartite medial cuneiform with low-level marrow edema along the cleft. 5. Mild degenerative subcortical foci of edema along the talar head and lateral navicular.     Electronically Signed   By: Ryan Salvage M.D.   On: 07/17/2023 11:49     Assessment:   1. Synovitis of toe       Plan:  Patient was evaluated and treated and all questions answered.  Assessment and Plan Assessment & Plan Right foot pain Neuroma still somewhat tender but minimal.  We discussed further injection here both sclerosing alcohol  and corticosteroid.  She is not interested in sclerosing alcohol   injections.  The fourth toe seems to bother her more so I recommend corticosteroid injection intra-articular of the joint which we have not done yet.  Discussed the risk benefits and potential complications of injection including the risk of infection.  Following consent the dorsal fourth MTPJ was prepped with Betadine  and ethyl chloride spray utilized as a topical anesthetic 10 mg of Kenalog   and 2.5 mg of Marcaine  was injected into the fourth MTP joint.  She tolerated this well and was dressed with a bandage.  Follow-up in 2 months to reevaluate.  Long-term we discussed surgical correction if these continue to be bothersome for her        Return in about 2 months (around 02/11/2024) for f/u after R foot injection .

## 2023-12-27 ENCOUNTER — Other Ambulatory Visit (HOSPITAL_BASED_OUTPATIENT_CLINIC_OR_DEPARTMENT_OTHER): Payer: Self-pay | Admitting: *Deleted

## 2023-12-27 DIAGNOSIS — E782 Mixed hyperlipidemia: Secondary | ICD-10-CM

## 2023-12-27 LAB — COMPREHENSIVE METABOLIC PANEL WITH GFR
ALT: 15 IU/L (ref 0–32)
AST: 24 IU/L (ref 0–40)
Albumin: 4.4 g/dL (ref 3.8–4.8)
Alkaline Phosphatase: 81 IU/L (ref 49–135)
BUN/Creatinine Ratio: 15 (ref 12–28)
BUN: 18 mg/dL (ref 8–27)
Bilirubin Total: 0.3 mg/dL (ref 0.0–1.2)
CO2: 26 mmol/L (ref 20–29)
Calcium: 9.2 mg/dL (ref 8.7–10.3)
Chloride: 98 mmol/L (ref 96–106)
Creatinine, Ser: 1.23 mg/dL — ABNORMAL HIGH (ref 0.57–1.00)
Globulin, Total: 2.8 g/dL (ref 1.5–4.5)
Glucose: 92 mg/dL (ref 70–99)
Potassium: 4.4 mmol/L (ref 3.5–5.2)
Sodium: 137 mmol/L (ref 134–144)
Total Protein: 7.2 g/dL (ref 6.0–8.5)
eGFR: 46 mL/min/1.73 — ABNORMAL LOW (ref >59)

## 2023-12-27 LAB — LIPID PANEL
Chol/HDL Ratio: 2.2 ratio (ref 0.0–4.4)
Cholesterol, Total: 159 mg/dL (ref 100–199)
HDL: 71 mg/dL (ref >39)
LDL Chol Calc (NIH): 74 mg/dL (ref 0–99)
Triglycerides: 71 mg/dL (ref 0–149)
VLDL Cholesterol Cal: 14 mg/dL (ref 5–40)

## 2024-01-08 ENCOUNTER — Telehealth (HOSPITAL_BASED_OUTPATIENT_CLINIC_OR_DEPARTMENT_OTHER): Payer: Self-pay | Admitting: Cardiovascular Disease

## 2024-01-08 MED ORDER — ROSUVASTATIN CALCIUM 20 MG PO TABS: 20.0000 mg | ORAL_TABLET | Freq: Every day | ORAL | 3 refills | Status: AC

## 2024-01-08 MED ORDER — OLMESARTAN MEDOXOMIL-HCTZ 40-12.5 MG PO TABS: 1.0000 | ORAL_TABLET | Freq: Every day | ORAL | 3 refills | Status: AC

## 2024-01-08 NOTE — Telephone Encounter (Signed)
 BP was at goal on her present regimen at recent clinic visit. If her cuff has not been checked for accuracy, would recommend nurse visit sometime next month to ensure accuracy. If she prefers to wait until her 02/2024 visit with Dr. Raford that is fine, continue current medications until then. Please ensure she is taking both olmesartan -hydrochlorothiazide  and amlodipine  as prescribed for BP control. Should continue Rosuvastatin  as well.   Toye Rouillard S Freada Twersky, NP

## 2024-01-08 NOTE — Telephone Encounter (Signed)
 Spoke with patient regarding her blood pressure readings Stated blood pressure consistently running mid 130's/70's Discussed goal being less than 130 and stated she would like it less than 130 Will forward to Reche ORN NP for review

## 2024-01-08 NOTE — Telephone Encounter (Signed)
 Advised patient, verbalized understanding  Confirmed taking Amlodipine  5 mg and Olmesaratan-hct  She has appointment with nephrologist 1/12 and will take her machine to that visit to check for accuracy   Sent in refills as requested

## 2024-01-08 NOTE — Telephone Encounter (Signed)
 Pt c/o medication issue:  1. Name of Medication:  rosuvastatin  (CRESTOR ) 20 MG tablet  olmesartan -hydrochlorothiazide  (BENICAR  HCT) 40-12.5 MG tablet   2. How are you currently taking this medication (dosage and times per day)? As written  3. Are you having a reaction (difficulty breathing--STAT)? No  4. What is your medication issue? Pt wants to know if she should  continue to take meds since her BP is getting better. Also stated if she needs refill they need to go to [facility]

## 2024-01-11 NOTE — Telephone Encounter (Signed)
 Spoke with [facility], Rx sent over for [name] and they have patient listed as [name] Spoke with patient C is her maiden name and J is for her middle name Advised pharmacist and stated they would make note in the system so would not bother anymore

## 2024-01-11 NOTE — Telephone Encounter (Signed)
 Pt called in and stated [facility] is trying to get in touch with someone to answer a few question about this these meds for the patient.    [phone]

## 2024-02-12 ENCOUNTER — Ambulatory Visit: Admitting: Podiatry

## 2024-02-25 ENCOUNTER — Ambulatory Visit (HOSPITAL_BASED_OUTPATIENT_CLINIC_OR_DEPARTMENT_OTHER): Admitting: Cardiovascular Disease

## 2024-04-22 ENCOUNTER — Ambulatory Visit (HOSPITAL_BASED_OUTPATIENT_CLINIC_OR_DEPARTMENT_OTHER): Admitting: Cardiovascular Disease
